# Patient Record
Sex: Female | Born: 1990 | Race: White | Hispanic: No | Marital: Single | Smoking: Former smoker
Health system: Southern US, Community
[De-identification: ages and names within clinical notes are randomized; demographics above are authoritative.]

## PROBLEM LIST (undated history)

## (undated) ENCOUNTER — Inpatient Hospital Stay: Payer: Self-pay

## (undated) DIAGNOSIS — D649 Anemia, unspecified: Secondary | ICD-10-CM

## (undated) DIAGNOSIS — R569 Unspecified convulsions: Secondary | ICD-10-CM

## (undated) DIAGNOSIS — F319 Bipolar disorder, unspecified: Secondary | ICD-10-CM

## (undated) DIAGNOSIS — F419 Anxiety disorder, unspecified: Secondary | ICD-10-CM

## (undated) HISTORY — PX: WISDOM TOOTH EXTRACTION: SHX21

## (undated) HISTORY — PX: OTHER SURGICAL HISTORY: SHX169

## (undated) HISTORY — PX: TONSILLECTOMY: SUR1361

## (undated) HISTORY — PX: ADENOIDECTOMY: SUR15

---

## 2015-10-09 ENCOUNTER — Emergency Department (HOSPITAL_COMMUNITY)
Admission: EM | Admit: 2015-10-09 | Discharge: 2015-10-09 | Disposition: A | Discharge status: Home / Self Care | Payer: Self-pay | Attending: Emergency Medicine | Admitting: Emergency Medicine

## 2015-10-09 ENCOUNTER — Encounter (HOSPITAL_COMMUNITY): Payer: Self-pay | Admitting: Emergency Medicine

## 2015-10-09 DIAGNOSIS — F17200 Nicotine dependence, unspecified, uncomplicated: Secondary | ICD-10-CM | POA: Insufficient documentation

## 2015-10-09 DIAGNOSIS — F419 Anxiety disorder, unspecified: Secondary | ICD-10-CM | POA: Insufficient documentation

## 2015-10-09 DIAGNOSIS — F319 Bipolar disorder, unspecified: Secondary | ICD-10-CM | POA: Insufficient documentation

## 2015-10-09 HISTORY — DX: Bipolar disorder, unspecified: F31.9

## 2015-10-09 HISTORY — DX: Anxiety disorder, unspecified: F41.9

## 2015-10-09 MED ORDER — LURASIDONE HCL 60 MG PO TABS: 60.0000 mg | ORAL_TABLET | Freq: Every evening | ORAL | Status: DC

## 2015-10-09 MED ORDER — BUSPIRONE HCL 10 MG PO TABS: 10.0000 mg | ORAL_TABLET | Freq: Two times a day (BID) | ORAL | Status: DC

## 2015-10-09 NOTE — ED Provider Notes (Signed)
CSN: [id number]     Arrival date & time 10/09/15  1628 History   First MD Initiated Contact with Patient 10/09/15 2008     Chief Complaint  Patient presents with  . Medical Clearance  . Medication Refill     (Consider location/radiation/quality/duration/timing/severity/associated sxs/prior Treatment) HPI Comments: 25 year old female with a history of bipolar 1 disorder and anxiety presents to the emergency department trying to get back on her medications. She states that she has been diagnosed with bipolar and was previously on BuSpar and JordanLatuda. She has been out of these medications since February 2017. She states that she has had worsening agitation and anxiety as well as difficulty forming complete thoughts. Patient also complaining of trouble focusing. She feels as though her symptoms represent some mild mania. She has had no suicidal or homicidal thoughts. She had difficulty getting her medication secondary to labs in insurance coverage. She moved to [place] 1 week ago and is now with family. She considers this to be a good support system. She has plans to reach out to behavioral health services in the area. She is hoping to get back on her medications that she was previously prescribed.  The history is provided by the patient. No language interpreter was used.    Past Medical History  Diagnosis Date  . Bipolar 1 disorder (HCC)   . Anxiety    Past Surgical History  Procedure Laterality Date  . Tailbone    . Tonsillectomy    . Adenoidectomy    . Wisdom tooth extraction     No family history on file. Social History  Substance Use Topics  . Smoking status: Current Some Day Smoker  . Smokeless tobacco: None  . Alcohol Use: Yes   OB History    No data available      Review of Systems  Psychiatric/Behavioral: Positive for behavioral problems and agitation. Negative for suicidal ideas. The patient is nervous/anxious.   All other systems reviewed and are  negative.   Allergies  Decadron  Home Medications   Prior to Admission medications   Medication Sig Start Date End Date Taking? Authorizing Provider  acetaminophen (TYLENOL) 500 MG tablet Take 1,000 mg by mouth every 6 (six) hours as needed (for migraine).   Yes Historical Provider, MD  B Complex Vitamins (B COMPLEX 1 PO) Take 1 mg by mouth 4 (four) times daily. Liquid   Yes Historical Provider, MD  Multiple Vitamins-Minerals (ONE-A-DAY FOR HER VITACRAVES) CHEW Chew 1 tablet by mouth daily.   Yes Historical Provider, MD  busPIRone (BUSPAR) 10 MG tablet Take 1 tablet (10 mg total) by mouth 2 (two) times daily. 10/09/15   Antony MaduraKelly Mindie Rawdon, PA-C  Lurasidone HCl (LATUDA) 60 MG TABS Take 60 mg by mouth every evening. 10/09/15   Antony MaduraKelly Fortunata Betty, PA-C   BP 123/93 mmHg  Pulse 91  Temp(Src) 99.2 F (37.3 C) (Oral)  Resp 18  SpO2 100%  LMP 09/28/2015   Physical Exam  Constitutional: She is oriented to person, place, and time. She appears well-developed and well-nourished. No distress.  HENT:  Head: Normocephalic and atraumatic.  Eyes: Conjunctivae and EOM are normal. No scleral icterus.  Neck: Normal range of motion.  Pulmonary/Chest: Effort normal. No respiratory distress.  Musculoskeletal: Normal range of motion.  Neurological: She is alert and oriented to person, place, and time. She exhibits normal muscle tone. Coordination normal.  Skin: Skin is warm and dry. No rash noted. She is not diaphoretic. No erythema. No pallor.  Psychiatric: Her  mood appears anxious (mild). Her affect is not angry. She is agitated (mild). She is not aggressive. She expresses no homicidal and no suicidal ideation.  Nursing note and vitals reviewed.   ED Course  Procedures (including critical care time) Labs Review Labs Reviewed - No data to display  Imaging Review No results found.   I have personally reviewed and evaluated these images and lab results as part of my medical decision-making.   EKG  Interpretation None      MDM   Final diagnoses:  Bipolar 1 disorder (HCC)    25 year old female presents to the emergency department requesting that her psychiatric medications be refilled. She has a history of bipolar 1 disorder, previously seen by psychiatry in [place], and has been off of her JordanLatuda and Buspar for 5 months. Patient with no SI/HI. She has moved to the area as this is where her family resides. She appears to have a good support system. Patient forming logical, well educated thoughts despite feeling as though her lack of medical management has been affecting her focus and memory. I do not believe her to be a harm to herself or others.  Patient making contact with outside psychiatric services for further care. I do not believe that refilling one month of her psychiatric medications is unreasonable. I discussed restarting these medications with Fayrene FearingJames, of pharmacy, to ensure no taper required. Will re-initiate Buspar 10mg  BID and Latuda 60mg  QHS. Resource guide and return precautions given. Patient discharged in satisfactory condition with no unaddressed concerns.   Filed Vitals:   10/09/15 1636  BP: 123/93  Pulse: 91  Temp: 99.2 F (37.3 C)  TempSrc: Oral  Resp: 18  SpO2: 100%     Antony MaduraKelly Ceniya Fowers, PA-C 10/09/15 2218  Jerelyn ScottMartha Linker, MD 10/09/15 2225

## 2015-10-09 NOTE — ED Notes (Signed)
Pt states 'I came here from [place] last week, ive been without my medication since April, i was diagnosed with Bipolar disorder, im just trying to get back on my medication., latuda and Buspar". Pt states "I'm very manic right now, im having trouble with memory, focusing and irritation.". Denies thoughts of hurting self or others.

## 2015-10-09 NOTE — Discharge Instructions (Signed)
Bipolar Disorder °Bipolar disorder is a mental illness. The term bipolar disorder actually is used to describe a group of disorders that all share varying degrees of emotional highs and lows that can interfere with daily functioning, such as work, school, or relationships. Bipolar disorder also can lead to drug abuse, hospitalization, and suicide. °The emotional highs of bipolar disorder are periods of elation or irritability and high energy. These highs can range from a mild form (hypomania) to a severe form (mania). People experiencing episodes of hypomania may appear energetic, excitable, and highly productive. People experiencing mania may behave impulsively or erratically. They often make poor decisions. They may have difficulty sleeping. The most severe episodes of mania can involve having very distorted beliefs or perceptions about the world and seeing or hearing things that are not real (psychotic delusions and hallucinations).  °The emotional lows of bipolar disorder (depression) also can range from mild to severe. Severe episodes of bipolar depression can involve psychotic delusions and hallucinations. °Sometimes people with bipolar disorder experience a state of mixed mood. Symptoms of hypomania or mania and depression are both present during this mixed-mood episode. °SIGNS AND SYMPTOMS °There are signs and symptoms of the episodes of hypomania and mania as well as the episodes of depression. The signs and symptoms of hypomania and mania are similar but vary in severity. They include: °· Inflated self-esteem or feeling of increased self-confidence. °· Decreased need for sleep. °· Unusual talkativeness (rapid or pressured speech) or the feeling of a need to keep talking. °· Sensation of racing thoughts or constant talking, with quick shifts between topics that may or may not be related (flight of ideas). °· Decreased ability to focus or concentrate. °· Increased purposeful activity, such as work, studies,  or social activity, or nonproductive activity, such as pacing, squirming and fidgeting, or finger and toe tapping. °· Impulsive behavior and use of poor judgment, resulting in high-risk activities, such as having unprotected sex or spending excessive amounts of money. °Signs and symptoms of depression include the following:  °· Feelings of sadness, hopelessness, or helplessness. °· Frequent or uncontrollable episodes of crying. °· Lack of feeling anything or caring about anything. °· Difficulty sleeping or sleeping too much.  °· Inability to enjoy the things you used to enjoy.   °· Desire to be alone all the time.   °· Feelings of guilt or worthlessness.  °· Lack of energy or motivation.   °· Difficulty concentrating, remembering, or making decisions.  °· Change in appetite or weight beyond normal fluctuations. °· Thoughts of death or the desire to harm yourself. °DIAGNOSIS  °Bipolar disorder is diagnosed through an assessment by your caregiver. Your caregiver will ask questions about your emotional episodes. There are two main types of bipolar disorder. People with type I bipolar disorder have manic episodes with or without depressive episodes. People with type II bipolar disorder have hypomanic episodes and major depressive episodes, which are more serious than mild depression. The type of bipolar disorder you have can make an important difference in how your illness is monitored and treated. °Your caregiver may ask questions about your medical history and use of alcohol or drugs, including prescription medication. Certain medical conditions and substances also can cause emotional highs and lows that resemble bipolar disorder (secondary bipolar disorder).  °TREATMENT  °Bipolar disorder is a long-term illness. It is best controlled with continuous treatment rather than treatment only when symptoms occur. The following treatments can be prescribed for bipolar disorders: °· Medication--Medication can be prescribed by  a doctor that   is an expert in treating mental disorders (psychiatrists). Medications called mood stabilizers are usually prescribed to help control the illness. Other medications are sometimes added if symptoms of mania, depression, or psychotic delusions and hallucinations occur despite the use of a mood stabilizer.  Talk therapy--Some forms of talk therapy are helpful in providing support, education, and guidance. A combination of medication and talk therapy is best for managing the disorder over time. A procedure in which electricity is applied to your brain through your scalp (electroconvulsive therapy) is used in cases of severe mania when medication and talk therapy do not work or work too slowly.   This information is not intended to replace advice given to you by your health care provider. Make sure you discuss any questions you have with your health care provider.   Document Released: 06/27/2000 Document Revised: 04/11/2014 Document Reviewed: 04/16/2012 Elsevier Interactive Patient Education 2016 [employer]  State Street CorporationCommunity Resource Guide Outpatient Counseling/Substance Abuse Adult The United Ways 211 is a great source of information about community services available.  Access by dialing 2-1-1 from anywhere in [place], or by website -  PooledIncome.plwww.nc211.org.   Other Local Resources (Updated 04/2015)  Crisis Hotlines   Services     Area Served  [employer] [facility]  Crisis Hotline, available 24 hours a day, 7 days a week: [phone] [address] [facility] [place], [place]   [facility]  Crisis Hotline, available 24 hours a day, 7 days a week: 6415656888458-348-1085 [facility] [place], [place]  [facility]  Suicide Prevention Hotline, available 24 hours a day, 7 days a week: [phone]) 398-8694 [facility] [place], [place]  [facility]   Crisis Hotline, available 24 hours a day, 7 days a week: [phone])502-4048 [facility] [place], [place]   [facility] Access to Ford Motor CompanyCare Line  Crisis Hotline, available 24  hours a day, 7 days a week: [phone])[phone] All   [facility]  Crisis Hotline, available 24 hours a day, 7 days a week: [phone](857)173-7484 All   Other Local Resources (Updated 04/2015)  Outpatient Counseling/ Substance Abuse Programs  Services     Address and Phone Number  ADS (Alcohol and Drug Services)   Options include Individual counseling, group counseling, intensive outpatient program (several hours a day, several days a week)  Offers depression assessments  Provides methadone maintenance program [phone] [address], [address] [place], [place] [address]   [facility]   Offers partial hospitalization/day treatment and DUI/DWI programs  Saks Incorporatedccepts Medicare, private insurance [phone] [address], [address] [place], [place] [address]  [facility]    Services include intensive outpatient program (several hours a day, several days a week), outpatient treatment, DUI/DWI services, family education  Also has some services specifically for [employer]eterans  Offers transitional housing  [phone] [address] [place], [place] [address]     [facility]  Saks Incorporatedccepts Medicare, private pay, and private insurance [phone] [address], [address] [place], [place] [address]  [facility]  Services include individual counseling, substance abuse intensive outpatient program (several hours a day, several days a week), day treatment  Delene Lollccepts Medicare, Medicaid, private insurance [phone] [address], [address] [place], [place] [address]  [facility]   Offers substance abuse intensive outpatient program (several hours a day, several days a week), partial hospitalization program [phone] [address] [place], [place] [address]  [phone] [address] [place], [place] [address]  [phone] [address] [place], [place]  [address]  [phone] [address]  [address] [place], [place] [address]  [facility]  Individual counseling only  Accepts private insurance only [phone] [address], [address] [place], [place] [address]  [facility]  Methadone maintenance program [phone] [address] [place], [place] [address]  [facility]  Walk-In Clinic providing substance abuse and mental health counseling  Accepts Medicaid, Medicare, private insurance  Offers sliding scale for uninsured [phone] [address] [place], [place]   [facility]Families, Avnetnc.  Offers individual counseling, and intensive in-home services [phone] [address], [address] [place], [place] [address]  [facility]  Offers individual counseling, family counseling, group therapy, domestic violence counseling, consumer credit counseling  Accepts Medicare, Medicaid, private insurance  Offers sliding scale for uninsured [phone] [address] [place], [place] [address]  [phone] [facility], [address] [place], [place] [address]  [facility]  Offers individual, family and group counseling  3 locations - [place], [place], and [place]  [phone]  [address] [place], [place] [address]  [address] [place], [place] [address]  [address] [place], [place] [address]  [facility]    Offers psychiatric assessment, 8-week Intensive Outpatient Program (several hours a day, several times a week, daytime or evenings), early recovery group, family Program, medication management  Private pay or private insurance only [phone], or  [phone] [address] [place], [place] [address]  [facility]  Offers individual, couples and family counseling  Accepts Medicaid, private insurance, and sliding scale for uninsured [phone] [address] [place], [place] [address]  Len Blalockavid Fuller, MD  Individual  counseling  Private insurance [phone] [address] [place], [place] [address]  [facility]   Offers assessment, substance abuse treatment, and behavioral health treatment [phone] [address] [place], [place] [address]  [facility]  Individual counseling  Accepts private insurance [phone] [address] [place], [place] [address]  [facility]  Individual counseling  Delene Lollccepts Medicare, private insurance [phone] [address] [place], [place] [address]  [facility]    Offers intensive outpatient program (several hours a day, several times a week)  Private pay, private insurance [phone] [facility]ol[address] [place], [place]  [facility]  Individual counseling  Medicare, private insurance [phone] [address], [address] [place], [place] [address]  [facility]    Offers intensive outpatient program (several hours a day, several times a week) and partial hospitalization program [phone] [address] [place], [place] [address]  Emerson MonteParrish McKinney, MD  Individual counseling [phone] [address], [address] [place], [place] [address]  [facility]  Offers Christian counseling to individuals, couples, and families  Accepts Medicare and private insurance; offers sliding scale for uninsured [phone] [address] [place], [place] [address]  [facility]  [facility] Retre[facility]ian counseling [phone] [address], [address] [place], [place] [address]  [facility]   Offers crisis counseling, individual counseling, group therapy, in-home therapy, domestic violence services, day treatment, DWI services, Administrator, artsCommunity Support Team (CST), Assertive Community Treatment Team (ACTT), substance abuse Intensive Outpatient Program (several hours a day, several times a week)  2  locations - [place] and [place] [phone] [address] [place], [place] [address]  [phone] [address] [place], [place] [address]  [facility]     Individual counseling and group therapy  Accepts private insurance, Middle RiverMedicare, [place]icaid [phone] [address] [place]o [place], [place]  [facility]  Offers individual and family counseling  Offers LGBTQ services  Accepts private insurance and private pay [phone] [address] [place], [place] [address]  [facility]    Offers individual counseling, group therapy, and outpatient detox  Accepts private insurance [phone] [address] [place], [place]  [facility]  Individual counseling  Accepts Medicare, private insurance [phone] [address], [address] [place], [place] [address]  [employer]  Individual counseling  Accepts Medicare, private insurance [phone] [address] [place], [place] [address]  [facility]   Offers substance abuse Intensive Outpatient Program (several hours a day, several times a week) [phone], or [phone] [place], [place]   [employer] The United Ways 211 is a great source of information about community services available.  Access by dialing 2-1-1 from anywhere in [place], or by website -  PooledIncome.pl.   Other Local Resources (Updated 04/2015)  Financial Assistance   Services    Phone Number and Address  [facility]  Low-cost medical care - 1st and 3rd Saturday of every month  Must not qualify for public or private insurance and must have limited income [phone] [address] [place], [place]    [place] [employer] of Social Services  Child care  Emergency assistance for housing and Kimberly-Clark  Medicaid [phone] [address] [place], [place] [address]   [facility] Department  Low-cost medical care for children, communicable diseases, sexually-transmitted diseases, immunizations, maternity care, womens health and family planning [phone] [address] [place], [place] [address]  [facility]   Medication assistance for [facility] residents  Must meet income requirements [phone] [address] [place], [place].    [facility] Social Services  Child care  Emergency assistance for housing and Kimberly-Clark  Medicaid [phone] [address] [place], [place] [address]  [facility]   Low-cost medical care,   Monday through Friday, 9 am to 6 pm.   Accepts Medicare/Medicaid, and self-pay [phone] [address] [place], [place] [address]  [facility]  Low-cost medical care - Monday through Friday, 8:30 am - 5:30 pm  Accepts Medicaid and self-pay [phone] [address] [place], [place] [address]   [facility]  Primary medical care, including for those with sickle cell disease  Accepts Medicare, Medicaid, insurance and self-pay [phone] [address] [place], [place]  [facility]   Primary medical care  Accepts Medicare, [place], insurance and self-pay [phone] [address], [address] [place], [place] [address]   [facility] Department of Social Services  Child care  Emergency assistance for housing and Kimberly-Clark  Medicaid [phone] [address] [place], [place] [address]  [facility] Department of Health and [employer]  Child care  Emergency assistance for housing and Kimberly-Clark  Medicaid [phone] [address] [place], [place] [address]   [facility] Medication Assistance Program  Medication assistance for  [facility] residents with no insurance only  Must have a primary care doctor [phone] [address], [address] [place], [place]  [facility]   Primary medical care  [place], [place], insurance  [phone] [address] [place], [place]  [employer]M   Medication assistance [phone]  [facility]   Primary  medical care  Accepts Medicare, Medicaid, insurance and self-pay [phone] [address] [place], [place] [address]  [facility]   Primary medical care  Accepts Medicare, [place], insurance and self-pay [phone] [address] [place], [place] [address]  [facility]  For [place] residents between the ages of 79 and 47 who do not have any form of health insurance, Medicare, [place], or [place] benefits.  Services are provided free of charge to uninsured patients who fall within federal poverty guidelines.    Hours: Tuesdays and Thursdays, 4:15 - 8 pm [phone] [address], [address] [place], [place] [address]  [facility]     Primary medical care  Dental care  Nutritional counseling  Pharmacy  Accepts Medicaid, Medicare, most insurance.  Fees are adjusted based on ability to pay.   [phone] [facility] [address] [place], [place]  [phone] [facility] [address] [place], [place]  [phone] [facility] [place], [place]  [phone] [facility], [address] [place], [place]  [phone] [facility] [address] [place], [place]  [facility]  Womens health and family planning [phone] [address] [place], [place]  [facility] Department of Social Services  Child care  Emergency assistance for housing and Kimberly-Clark  Medicaid [phone] N.  [address] [place], [place] [address]   [facility]    Ages 22 and older  Hours: Mondays and Thursdays, 7:00 am - 9:00 am Patients are seen on a first come, first served basis. [phone], [phone] [place], [place]  [facility] Division of Social Services  Child care  Emergency assistance for housing and Kimberly-Clark  Medicaid [phone] [address] [place], [place] [address]  [facility]  Medication assistance  Rental assistance  Food pantry  Medication assistance  Housing assistance  Emergency food distribution  Utility assistance [phone] [address] [place], [place]  [phone]  [address] [place], [place] [address] Hours: Tuesdays and Thursdays from 9am - 12 noon by appointment only  [phone] [address] [place], [place] [address]  [facility] - Lanae Boast   Accepts private insurance, [place], and [place].  Payment is based on a sliding scale for those without insurance.  Hours: Mondays, Tuesdays and Thursdays, 8:30 am - 5:30 pm.   [phone] [address] Robin[address] [place], [place]  [facility] - Family Medicine at [facility], [place], and [place].  Payment is based on a sliding scale for those without insurance. [phone] [address] [place], [place]  [facility] - Pediatrics at E. Scientist, research (physical sciences), [employer], and [place].  Payment is based on a sliding scale for those without insurance [phone] [address], [employer], [place]  [facility] - Pediatrics at [employer], [place], and [place].  Payment is based on a sliding scale for those without insurance. [phone] [address] [place], [place]  [facility] - Pediatrics at [facility], [place], and [place].  Payment is based on a sliding scale for those without insurance. [phone], [phone] [place], [place].    [facility]  Maternity care.  Accepts Medicaid and self-pay. [phone] [address]  [address] [place], [place]

## 2015-10-09 NOTE — ED Notes (Signed)
Pt st's she has been out her meds for for several months.  Pt st's she had a refill at the time but did not have the money to get them filled.

## 2015-10-13 ENCOUNTER — Ambulatory Visit: Payer: Self-pay

## 2015-10-18 ENCOUNTER — Encounter (HOSPITAL_COMMUNITY): Payer: Self-pay | Admitting: Emergency Medicine

## 2015-10-18 ENCOUNTER — Emergency Department (HOSPITAL_COMMUNITY): Payer: Self-pay

## 2015-10-18 ENCOUNTER — Emergency Department (HOSPITAL_COMMUNITY)
Admission: EM | Admit: 2015-10-18 | Discharge: 2015-10-18 | Disposition: A | Discharge status: Home / Self Care | Payer: Self-pay | Attending: Emergency Medicine | Admitting: Emergency Medicine

## 2015-10-18 DIAGNOSIS — N390 Urinary tract infection, site not specified: Secondary | ICD-10-CM | POA: Insufficient documentation

## 2015-10-18 DIAGNOSIS — R109 Unspecified abdominal pain: Secondary | ICD-10-CM | POA: Insufficient documentation

## 2015-10-18 DIAGNOSIS — Z79899 Other long term (current) drug therapy: Secondary | ICD-10-CM | POA: Insufficient documentation

## 2015-10-18 DIAGNOSIS — F17200 Nicotine dependence, unspecified, uncomplicated: Secondary | ICD-10-CM | POA: Insufficient documentation

## 2015-10-18 LAB — COMPREHENSIVE METABOLIC PANEL
ALBUMIN: 4.1 g/dL (ref 3.5–5.0)
ALT: 14 U/L (ref 14–54)
ANION GAP: 6 (ref 5–15)
AST: 17 U/L (ref 15–41)
Alkaline Phosphatase: 63 U/L (ref 38–126)
BUN: 8 mg/dL (ref 6–20)
CHLORIDE: 105 mmol/L (ref 101–111)
CO2: 28 mmol/L (ref 22–32)
Calcium: 9.4 mg/dL (ref 8.9–10.3)
Creatinine, Ser: 0.77 mg/dL (ref 0.44–1.00)
GFR calc Af Amer: >60 mL/min (ref >60)
GFR calc non Af Amer: >60 mL/min (ref >60)
GLUCOSE: 89 mg/dL (ref 65–99)
POTASSIUM: 3.6 mmol/L (ref 3.5–5.1)
SODIUM: 139 mmol/L (ref 135–145)
TOTAL PROTEIN: 7 g/dL (ref 6.5–8.1)
Total Bilirubin: 0.8 mg/dL (ref 0.3–1.2)

## 2015-10-18 LAB — CBC WITH DIFFERENTIAL/PLATELET
BASOS ABS: 0.1 10*3/uL (ref 0.0–0.1)
BASOS PCT: 1 %
EOS ABS: 0.5 10*3/uL (ref 0.0–0.7)
EOS PCT: 4 %
HCT: 42.8 % (ref 36.0–46.0)
Hemoglobin: 13.9 g/dL (ref 12.0–15.0)
Lymphocytes Relative: 30 %
Lymphs Abs: 3.4 10*3/uL (ref 0.7–4.0)
MCH: 28.7 pg (ref 26.0–34.0)
MCHC: 32.5 g/dL (ref 30.0–36.0)
MCV: 88.4 fL (ref 78.0–100.0)
MONO ABS: 0.5 10*3/uL (ref 0.1–1.0)
MONOS PCT: 5 %
NEUTROS ABS: 6.8 10*3/uL (ref 1.7–7.7)
Neutrophils Relative %: 60 %
PLATELETS: 354 10*3/uL (ref 150–400)
RBC: 4.84 MIL/uL (ref 3.87–5.11)
RDW: 12.7 % (ref 11.5–15.5)
WBC: 11.2 10*3/uL — ABNORMAL HIGH (ref 4.0–10.5)

## 2015-10-18 LAB — LIPASE, BLOOD: Lipase: 39 U/L (ref 11–51)

## 2015-10-18 LAB — URINALYSIS, ROUTINE W REFLEX MICROSCOPIC
Bilirubin Urine: NEGATIVE
GLUCOSE, UA: NEGATIVE mg/dL
Ketones, ur: NEGATIVE mg/dL
NITRITE: NEGATIVE
PROTEIN: NEGATIVE mg/dL
Specific Gravity, Urine: 1.009 (ref 1.005–1.030)
pH: 6 (ref 5.0–8.0)

## 2015-10-18 LAB — URINE MICROSCOPIC-ADD ON

## 2015-10-18 LAB — I-STAT BETA HCG BLOOD, ED (MC, WL, AP ONLY): I-stat hCG, quantitative: 14.9 m[IU]/mL — ABNORMAL HIGH (ref <5)

## 2015-10-18 MED ORDER — CEPHALEXIN 500 MG PO CAPS: 500.0000 mg | ORAL_CAPSULE | Freq: Two times a day (BID) | ORAL | Status: DC

## 2015-10-18 MED ORDER — PHENAZOPYRIDINE HCL 200 MG PO TABS: 200.0000 mg | ORAL_TABLET | Freq: Three times a day (TID) | ORAL | Status: DC

## 2015-10-18 MED ORDER — KETOROLAC TROMETHAMINE 30 MG/ML IJ SOLN: 30.0000 mg | Freq: Once | INTRAMUSCULAR | Status: DC

## 2015-10-18 MED ORDER — KETOROLAC TROMETHAMINE 30 MG/ML IJ SOLN
30.0000 mg | Freq: Once | INTRAMUSCULAR | Status: AC
  Administered 2015-10-18: 30 mg via INTRAVENOUS
  Filled 2015-10-18: qty 1

## 2015-10-18 MED ORDER — MORPHINE SULFATE (PF) 4 MG/ML IV SOLN
4.0000 mg | Freq: Once | INTRAVENOUS | Status: AC
  Administered 2015-10-18: 4 mg via INTRAVENOUS
  Filled 2015-10-18: qty 1

## 2015-10-18 MED ORDER — SODIUM CHLORIDE 0.9 % IV BOLUS (SEPSIS)
1000.0000 mL | Freq: Once | INTRAVENOUS | Status: AC
  Administered 2015-10-18: 1000 mL via INTRAVENOUS

## 2015-10-18 NOTE — ED Notes (Signed)
CT called r/e patient's hcg result - pt reports just having had a miscarriage the last week in June - [place], [place] discussed with Dr. Deretha EmoryZackowski, who sts it is normally increased after miscarriage and that pt is okay to go for CT. PA explained risks/benefits with patient, who agrees to have scan done.

## 2015-10-18 NOTE — ED Notes (Signed)
Patient ambulatory to restroom with steady gait.

## 2015-10-18 NOTE — ED Provider Notes (Signed)
CSN: [id number]     Arrival date & time 10/18/15  1522 History  By signing my name below, I, Evon Slack, attest that this documentation has been prepared under the direction and in the presence of [employer], [place]. Electronically Signed: Evon Slack, ED Scribe. 10/18/2015. 5:45 PM.     Chief Complaint  Patient presents with  . Dysuria   The history is provided by the patient. No language interpreter was used.   HPI Comments: Jill Daniel is a 25 y.o. female who presents to the Emergency Department complaining of dysuria onset 4 days prior. Pt reports that she has associated right flank pain, frequency, urgency and hematuria. She also reports nausea, vomiting x1 yesterday. She states that the flank pain does radiate intermittently around to her groin when urinating. She describes the pain when urinating as pressure on her low back. Pt denies any medications PTA. Denies fever, chills, CP, SOB, abdominal pain, vaginal bleeding or vaginal discharge. Denies Hx of abdominal surgeries. LMP 09/28/15. Pt reports that on 09/28/15 she was hospitalized in [place] for a miscarriage. She denies any complications and states she was d/c home a few days later. She notes she recently moved from [place] and has not followed up with an OBGYN since being discharged. Pt denies being sexually active since her miscarriage.   Past Medical History  Diagnosis Date  . Bipolar 1 disorder (HCC)   . Anxiety    Past Surgical History  Procedure Laterality Date  . Tailbone    . Tonsillectomy    . Adenoidectomy    . Wisdom tooth extraction     No family history on file. Social History  Substance Use Topics  . Smoking status: Current Some Day Smoker  . Smokeless tobacco: None  . Alcohol Use: Yes   OB History    No data available      Review of Systems  Constitutional: Negative for fever.  Gastrointestinal: Positive for vomiting.  Genitourinary: Positive for dysuria, urgency,  frequency, hematuria and flank pain. Negative for vaginal bleeding and vaginal discharge.     Allergies  Decadron  Home Medications   Prior to Admission medications   Medication Sig Start Date End Date Taking? Authorizing Provider  acetaminophen (TYLENOL) 500 MG tablet Take 1,000 mg by mouth every 6 (six) hours as needed (for migraine).    Historical Provider, MD  B Complex Vitamins (B COMPLEX 1 PO) Take 1 mg by mouth 4 (four) times daily. Liquid    Historical Provider, MD  busPIRone (BUSPAR) 10 MG tablet Take 1 tablet (10 mg total) by mouth 2 (two) times daily. 10/09/15   Antony Madura, PA-C  cephALEXin (KEFLEX) 500 MG capsule Take 1 capsule (500 mg total) by mouth 2 (two) times daily. 10/18/15   Barrett Henle, PA-C  Lurasidone HCl (LATUDA) 60 MG TABS Take 60 mg by mouth every evening. 10/09/15   Antony Madura, PA-C  Multiple Vitamins-Minerals (ONE-A-DAY FOR HER VITACRAVES) CHEW Chew 1 tablet by mouth daily.    Historical Provider, MD  phenazopyridine (PYRIDIUM) 200 MG tablet Take 1 tablet (200 mg total) by mouth 3 (three) times daily. 10/18/15   Satira Sark Deyani Hegarty, PA-C   BP 114/64 mmHg  Pulse 68  Temp(Src) 98.8 F (37.1 C) (Oral)  Resp 16  Ht  (1.575 m)  Wt 75.297 kg  BMI 30.35 kg/m2  SpO2 100%  LMP 09/28/2015   Physical Exam  Constitutional: She is oriented to person, place, and time. She appears well-developed  and well-nourished. No distress.  HENT:  Head: Normocephalic and atraumatic.  Mouth/Throat: Uvula is midline, oropharynx is clear and moist and mucous membranes are normal. No oropharyngeal exudate, posterior oropharyngeal edema, posterior oropharyngeal erythema or tonsillar abscesses.  Eyes: Conjunctivae and EOM are normal. Right eye exhibits no discharge. Left eye exhibits no discharge. No scleral icterus.  Neck: Normal range of motion. Neck supple.  Cardiovascular: Normal rate, regular rhythm, normal heart sounds and intact distal pulses.    Pulmonary/Chest: Effort normal and breath sounds normal. No respiratory distress. She has no wheezes. She has no rales. She exhibits no tenderness.  Abdominal: Soft. Bowel sounds are normal. She exhibits no distension and no mass. There is no tenderness. There is no rebound and no guarding.  Right CVA tenderness.  Musculoskeletal: Normal range of motion. She exhibits no edema.  Lymphadenopathy:    She has no cervical adenopathy.  Neurological: She is alert and oriented to person, place, and time.  Skin: Skin is warm and dry. She is not diaphoretic.  Nursing note and vitals reviewed.   ED Course  Procedures (including critical care time) DIAGNOSTIC STUDIES: Oxygen Saturation is 98% on RA, normal by my interpretation.    COORDINATION OF CARE: 5:45 PM-Discussed treatment plan which includes UA with pt at bedside and pt agreed to plan.     Labs Review Labs Reviewed  URINALYSIS, ROUTINE W REFLEX MICROSCOPIC (NOT AT [facility]) - Abnormal; Notable for the following:    APPearance CLOUDY (*)    Hgb urine dipstick LARGE (*)    Leukocytes, UA LARGE (*)    All other components within normal limits  URINE MICROSCOPIC-ADD ON - Abnormal; Notable for the following:    Squamous Epithelial / LPF 0-5 (*)    Bacteria, UA FEW (*)    All other components within normal limits  CBC WITH DIFFERENTIAL/PLATELET - Abnormal; Notable for the following:    WBC 11.2 (*)    All other components within normal limits  I-STAT BETA HCG BLOOD, ED ([facility], [facility], [facility] ONLY) - Abnormal; Notable for the following:    I-stat hCG, quantitative 14.9 (*)    All other components within normal limits  URINE CULTURE  COMPREHENSIVE METABOLIC PANEL  LIPASE, BLOOD  POC URINE PREG, ED    Imaging Review Ct Renal Stone Study  10/18/2015  CLINICAL DATA:  Hematuria.  Right-sided pain. EXAM: CT ABDOMEN AND PELVIS WITHOUT CONTRAST TECHNIQUE: Multidetector CT imaging of the abdomen and pelvis was performed following the standard protocol  without IV contrast. COMPARISON:  None. FINDINGS: Mild dependent atelectasis. No other abnormalities in the lung bases. No free air.  A small amount of free fluid is seen in the pelvis. No renal stones, hydronephrosis, or perinephric stranding. No ureteral stones are identified. The gallbladder is decompressed limiting evaluation with no abnormalities identified. The liver, spleen, adrenal glands, and pancreas are normal. The aorta is normal in caliber with no atherosclerosis. No adenopathy identified. The stomach and small bowel are normal. The colon is normal. High attenuation of normal caliber appendix could represent contrast for previous study versus an appendicolith. There is no evidence of acute appendicitis. The uterus is normal in appearance. There is a small amount of free fluid in the pelvis which could be physiologic. A right ovarian cyst is not excluded. No adenopathy or suspicious mass seen in the pelvis. Visualized bones are unremarkable. IMPRESSION: 1. No renal stones or obstruction. 2. High attenuation the proximal appendix could represent contrast from a previous study or a  small appendicolith. No evidence of appendicitis. 3. The small amount of free fluid in the pelvis is likely physiologic. A small cyst is not excluded in the right ovary. Electronically Signed   By: Gerome Sam III M.D   On: 10/18/2015 21:40   I have personally reviewed and evaluated these lab results as part of my medical decision-making.   EKG Interpretation None      MDM   Final diagnoses:  UTI (lower urinary tract infection)   Patient presents with right flank pain and associated dysuria, frequency, urgency and hematuria. Patient notes she had a miscarriage on 09/28/15. Denies any vaginal bleeding or discharge. She notes she has not followed up with OB/GYN due to recently moving from [place]. Denies fever. VSS. Exam revealed right CVA tenderness, abdominal exam benign, remaining exam unremarkable. Patient  given IV fluids, Toradol. Beta hCG 14.9, based off pt's reported hx of a miscarriage a few weeks ago, mildly elevated beta hcg appears to be consistent with values related to recent miscarriage and not a new pregnancy. UA positive for large hgb, large leuks, TNTC WBCs. Urine culture sent. WBC 11.2. Due to concern for kidney stone with associated UTI, will order CT renal study for further evaluation. CT revealed no renal stones, no evidence of appendicitis. On reevaluation pt reports her pain has improved. Pt able to tolerate PO in the ED. I suspect pt's sxs are likely due to UTI. Plan to discharge patient home with Keflex and symptomatically treatment. Discussed results and plan for discharge with patient. Patient given resources to follow up and establish care with with PCP and OB/GYN. Discussed return precautions with patient.  I personally performed the services described in this documentation, which was scribed in my presence. The recorded information has been reviewed and is accurate.      Satira Sark [place], [place] 10/18/15 5621  Vanetta Mulders, MD 10/19/15 [phone]

## 2015-10-18 NOTE — ED Notes (Signed)
Patient verbalized understanding of discharge instructions and denies any further needs or questions at this time. VS stable. Patient ambulatory with steady gait, RN escorted to ED entrance in wheelchair.   

## 2015-10-18 NOTE — ED Notes (Signed)
Patient returned from CT, requesting pain medication. PA aware.

## 2015-10-18 NOTE — ED Notes (Signed)
C/o painful urination and R side pain when urinating since last night.  Reports hematuria today.

## 2015-10-18 NOTE — Discharge Instructions (Signed)
Take your medications as prescribed. Please follow up with a primary care provider from the Resource Guide provided below in one week if her symptoms have not improved. Please return to the Emergency Department if symptoms worsen or new onset of fever, worsening flank pain or new abdominal pain, vomiting, unable to keep fluids down, unable to urinate.

## 2015-10-18 NOTE — ED Notes (Signed)
Informed consent/waiver signed by PA and patient. Patient transported to CT.

## 2015-10-20 ENCOUNTER — Encounter (HOSPITAL_COMMUNITY): Payer: Self-pay | Admitting: Emergency Medicine

## 2015-10-20 ENCOUNTER — Emergency Department (HOSPITAL_COMMUNITY): Payer: Self-pay

## 2015-10-20 ENCOUNTER — Emergency Department (HOSPITAL_COMMUNITY)
Admission: EM | Admit: 2015-10-20 | Discharge: 2015-10-21 | Disposition: A | Discharge status: Home / Self Care | Payer: Self-pay | Attending: Emergency Medicine | Admitting: Emergency Medicine

## 2015-10-20 DIAGNOSIS — R102 Pelvic and perineal pain: Secondary | ICD-10-CM | POA: Insufficient documentation

## 2015-10-20 DIAGNOSIS — N730 Acute parametritis and pelvic cellulitis: Secondary | ICD-10-CM | POA: Insufficient documentation

## 2015-10-20 DIAGNOSIS — F17200 Nicotine dependence, unspecified, uncomplicated: Secondary | ICD-10-CM | POA: Insufficient documentation

## 2015-10-20 LAB — URINALYSIS, ROUTINE W REFLEX MICROSCOPIC
BILIRUBIN URINE: NEGATIVE
Glucose, UA: NEGATIVE mg/dL
HGB URINE DIPSTICK: NEGATIVE
KETONES UR: NEGATIVE mg/dL
Leukocytes, UA: NEGATIVE
Nitrite: POSITIVE — AB
PROTEIN: NEGATIVE mg/dL
Specific Gravity, Urine: 1.016 (ref 1.005–1.030)
pH: 6 (ref 5.0–8.0)

## 2015-10-20 LAB — COMPREHENSIVE METABOLIC PANEL
ALBUMIN: 4 g/dL (ref 3.5–5.0)
ALK PHOS: 59 U/L (ref 38–126)
ALT: 16 U/L (ref 14–54)
ANION GAP: 5 (ref 5–15)
AST: 19 U/L (ref 15–41)
BILIRUBIN TOTAL: 0.1 mg/dL — AB (ref 0.3–1.2)
BUN: 8 mg/dL (ref 6–20)
CALCIUM: 9.1 mg/dL (ref 8.9–10.3)
CO2: 26 mmol/L (ref 22–32)
CREATININE: 0.92 mg/dL (ref 0.44–1.00)
Chloride: 107 mmol/L (ref 101–111)
GFR calc non Af Amer: >60 mL/min (ref >60)
GLUCOSE: 92 mg/dL (ref 65–99)
POTASSIUM: 4 mmol/L (ref 3.5–5.1)
SODIUM: 138 mmol/L (ref 135–145)
Total Protein: 6.8 g/dL (ref 6.5–8.1)

## 2015-10-20 LAB — CBC
HCT: 40.4 % (ref 36.0–46.0)
Hemoglobin: 13.4 g/dL (ref 12.0–15.0)
MCH: 28.9 pg (ref 26.0–34.0)
MCHC: 33.2 g/dL (ref 30.0–36.0)
MCV: 87.1 fL (ref 78.0–100.0)
PLATELETS: 334 10*3/uL (ref 150–400)
RBC: 4.64 MIL/uL (ref 3.87–5.11)
RDW: 12.6 % (ref 11.5–15.5)
WBC: 11.8 10*3/uL — ABNORMAL HIGH (ref 4.0–10.5)

## 2015-10-20 LAB — URINE MICROSCOPIC-ADD ON

## 2015-10-20 LAB — URINE CULTURE

## 2015-10-20 LAB — I-STAT CG4 LACTIC ACID, ED: Lactic Acid, Venous: 0.97 mmol/L (ref 0.5–1.9)

## 2015-10-20 LAB — I-STAT BETA HCG BLOOD, ED (MC, WL, AP ONLY): HCG, QUANTITATIVE: 10.1 m[IU]/mL — AB (ref <5)

## 2015-10-20 LAB — LIPASE, BLOOD: LIPASE: 28 U/L (ref 11–51)

## 2015-10-20 MED ORDER — SODIUM CHLORIDE 0.9 % IV BOLUS (SEPSIS)
1000.0000 mL | Freq: Once | INTRAVENOUS | Status: AC
  Administered 2015-10-20: 1000 mL via INTRAVENOUS

## 2015-10-20 MED ORDER — MORPHINE SULFATE (PF) 4 MG/ML IV SOLN
4.0000 mg | Freq: Once | INTRAVENOUS | Status: AC
  Administered 2015-10-20: 4 mg via INTRAVENOUS
  Filled 2015-10-20: qty 1

## 2015-10-20 MED ORDER — ONDANSETRON HCL 4 MG/2ML IJ SOLN
4.0000 mg | Freq: Once | INTRAMUSCULAR | Status: AC
Start: 2015-10-20 — End: 2015-10-20
  Administered 2015-10-20: 4 mg via INTRAVENOUS
  Filled 2015-10-20: qty 2

## 2015-10-20 NOTE — ED Notes (Signed)
Pt's mother called this RN into room and pt stated "a few minutes ago I was lying here and it felt like my heart started racing and I got spaced out and my oxygen level dropped and my chest started hutning" Pt alert and oriented x 4 and CP free now. This RN reviewed minute by minute HR and HR has not been above 100 according to the monitor. Pt has finger nail polish on and pulse oxemeter readjusted

## 2015-10-20 NOTE — ED Notes (Signed)
Pt sts currently being treated for UTI but not improving; pt sts fever and body aches

## 2015-10-20 NOTE — ED Provider Notes (Signed)
CSN: [id number]     Arrival date & time 10/20/15  1806 History   First MD Initiated Contact with Patient 10/20/15 2059     Chief Complaint  Patient presents with  . Urinary Tract Infection  . Fever     (Consider location/radiation/quality/duration/timing/severity/associated sxs/prior Treatment) HPI Patient was seen 2 days ago for dysuria. Complaint of flank pain and lower abdominal pain and time. CT renal stone study without any acute findings. Patient had many white blood cell UA. Treated for UTI with Keflex. Patient states that the pain has worsened in her abdomen and low back since that time. She has Episodic lightheadedness with standing and palpitations. She endorses subjective fevers. She denies any vaginal bleeding or discharge at this time. Patient recently had spontaneous abortion while living in [place]. Urinary symptoms have improved. She has normal bowel movements without diarrhea. Denies nausea or vomiting. Past Medical History  Diagnosis Date  . Bipolar 1 disorder (HCC)   . Anxiety    Past Surgical History  Procedure Laterality Date  . Tailbone    . Tonsillectomy    . Adenoidectomy    . Wisdom tooth extraction     History reviewed. No pertinent family history. Social History  Substance Use Topics  . Smoking status: Current Some Day Smoker  . Smokeless tobacco: None  . Alcohol Use: Yes   OB History    No data available     Review of Systems  Constitutional: Positive for fever, chills and fatigue.  Respiratory: Negative for cough and shortness of breath.   Cardiovascular: Positive for palpitations. Negative for chest pain.  Gastrointestinal: Positive for abdominal pain and abdominal distention. Negative for nausea, vomiting, diarrhea, constipation and anal bleeding.  Genitourinary: Positive for flank pain and pelvic pain. Negative for dysuria, frequency, hematuria, vaginal bleeding and vaginal discharge.  Musculoskeletal: Positive for myalgias and back pain.  Negative for neck pain and neck stiffness.  Skin: Negative for rash and wound.  Neurological: Positive for dizziness and light-headedness. Negative for syncope, weakness, numbness and headaches.  Psychiatric/Behavioral: The patient is nervous/anxious.   All other systems reviewed and are negative.     Allergies  Decadron and Lactose intolerance (gi)  Home Medications   Prior to Admission medications   Medication Sig Start Date End Date Taking? Authorizing Provider  acetaminophen (TYLENOL) 500 MG tablet Take 1,000 mg by mouth every 6 (six) hours as needed (for migraine).   Yes Historical Provider, MD  B Complex Vitamins (B COMPLEX 1 PO) Take 1 mg by mouth 4 (four) times daily. Liquid   Yes Historical Provider, MD  busPIRone (BUSPAR) 10 MG tablet Take 1 tablet (10 mg total) by mouth 2 (two) times daily. 10/09/15  Yes Antony Madura, PA-C  cephALEXin (KEFLEX) 500 MG capsule Take 1 capsule (500 mg total) by mouth 2 (two) times daily. 10/18/15  Yes Barrett Henle, PA-C  Lurasidone HCl (LATUDA) 60 MG TABS Take 60 mg by mouth every evening. 10/09/15  Yes Antony Madura, PA-C  Multiple Vitamins-Minerals (ONE-A-DAY FOR HER VITACRAVES) CHEW Chew 1 tablet by mouth daily.   Yes Historical Provider, MD  phenazopyridine (PYRIDIUM) 200 MG tablet Take 1 tablet (200 mg total) by mouth 3 (three) times daily. 10/18/15  Yes Satira Sark [place]adeau, PA-C   BP 118/72 mmHg  Pulse 87  Temp(Src) 98.3 F (36.8 C) (Oral)  Resp 18  SpO2 99%  LMP 09/28/2015 Physical Exam  Constitutional: She is oriented to person, place, and time. She appears well-developed and well-nourished. No  distress.  Anxious appearing  HENT:  Head: Normocephalic and atraumatic.  Mouth/Throat: Oropharynx is clear and moist. No oropharyngeal exudate.  Eyes: EOM are normal. Pupils are equal, round, and reactive to light.  Neck: Normal range of motion. Neck supple.  Cardiovascular: Normal rate and regular rhythm.   Pulmonary/Chest: Effort  normal and breath sounds normal. No respiratory distress. She has no wheezes. She has no rales.  Abdominal: Soft. Bowel sounds are normal. She exhibits distension (mild distention). She exhibits no mass. There is tenderness (diffuse abdominal tenderness especially in the lower abdomen. No rebound or guarding.). There is no rebound and no guarding.  Genitourinary:  Copious amount of purulent discharge coming from the os. Cervical motion tenderness. Fundal tenderness and bilateral adnexal tenderness to the left adnexa appears to be more tender than the right.  Musculoskeletal: Normal range of motion. She exhibits tenderness. She exhibits no edema.  Bilateral CVA tenderness and lumbar paraspinal tenderness.  Neurological: She is alert and oriented to person, place, and time.  Moves all extremities without deficit. Sensation is fully intact.  Skin: Skin is warm and dry. No rash noted. No erythema.  Psychiatric: She has a normal mood and affect. Her behavior is normal.  Nursing note and vitals reviewed.   ED Course  Procedures (including critical care time) Labs Review Labs Reviewed  COMPREHENSIVE METABOLIC PANEL - Abnormal; Notable for the following:    Total Bilirubin 0.1 (*)    All other components within normal limits  CBC - Abnormal; Notable for the following:    WBC 11.8 (*)    All other components within normal limits  URINALYSIS, ROUTINE W REFLEX MICROSCOPIC (NOT AT [facility]) - Abnormal; Notable for the following:    Color, Urine ORANGE (*)    APPearance CLOUDY (*)    Nitrite POSITIVE (*)    All other components within normal limits  URINE MICROSCOPIC-ADD ON - Abnormal; Notable for the following:    Squamous Epithelial / LPF 6-30 (*)    Bacteria, UA RARE (*)    All other components within normal limits  I-STAT BETA HCG BLOOD, ED ([facility], [facility], [facility] ONLY) - Abnormal; Notable for the following:    I-stat hCG, quantitative 10.1 (*)    All other components within normal limits  URINE CULTURE   WET PREP, GENITAL  LIPASE, BLOOD  I-STAT CG4 LACTIC ACID, ED  GC/CHLAMYDIA PROBE AMP ([place]) NOT AT [facility]    Imaging Review No results found. I have personally reviewed and evaluated these images and lab results as part of my medical decision-making.   EKG Interpretation   Date/Time:  Tuesday October 20 2015 22:43:07 EDT Ventricular Rate:  90 PR Interval:    QRS Duration: 91 QT Interval:  335 QTC Calculation: 410 R Axis:   38 Text Interpretation:  Sinus rhythm Confirmed by Ranae PalmsYELVERTON  MD, Delvonte Berenson  ([id number]) on 10/20/2015 10:51:11 PM      MDM   Final diagnoses:  None    Concern for retained products and endometritis versus PID. Ultrasound pelvis and given IV fluids. Urine culture inconclusive on previous visit.    Loren Raceravid Sherin Murdoch, MD 10/20/15 2251

## 2015-10-20 NOTE — ED Notes (Signed)
Dr  Yelverton in room.  

## 2015-10-20 NOTE — ED Notes (Signed)
Patient transported to Ultrasound 

## 2015-10-21 LAB — GC/CHLAMYDIA PROBE AMP (~~LOC~~) NOT AT ARMC
CHLAMYDIA, DNA PROBE: NEGATIVE
Neisseria Gonorrhea: NEGATIVE

## 2015-10-21 LAB — WET PREP, GENITAL
Sperm: NONE SEEN
TRICH WET PREP: NONE SEEN
YEAST WET PREP: NONE SEEN

## 2015-10-21 MED ORDER — AZITHROMYCIN 250 MG PO TABS
1000.0000 mg | ORAL_TABLET | Freq: Once | ORAL | Status: AC
  Administered 2015-10-21: 1000 mg via ORAL
  Filled 2015-10-21: qty 4

## 2015-10-21 MED ORDER — DEXTROSE 5 % IV SOLN
250.0000 mg | Freq: Once | INTRAVENOUS | Status: AC
  Administered 2015-10-21: 250 mg via INTRAVENOUS
  Filled 2015-10-21: qty 250

## 2015-10-21 MED ORDER — CEFTRIAXONE SODIUM 250 MG IJ SOLR: 250.0000 mg | Freq: Once | INTRAMUSCULAR | Status: DC

## 2015-10-21 MED ORDER — DOXYCYCLINE HYCLATE 100 MG PO CAPS: 100.0000 mg | ORAL_CAPSULE | Freq: Two times a day (BID) | ORAL | Status: DC

## 2015-10-21 MED ORDER — HYDROCODONE-ACETAMINOPHEN 5-325 MG PO TABS: 1.0000 | ORAL_TABLET | Freq: Four times a day (QID) | ORAL | Status: DC | PRN

## 2015-10-21 MED ORDER — IBUPROFEN 600 MG PO TABS: 600.0000 mg | ORAL_TABLET | Freq: Four times a day (QID) | ORAL | Status: DC | PRN

## 2015-10-21 NOTE — ED Notes (Signed)
Patient Alert and oriented X4. Stable and ambulatory. Patient verbalized understanding of the discharge instructions.  Patient belongings were taken by the patient.  

## 2015-10-21 NOTE — Discharge Instructions (Signed)

## 2015-10-22 LAB — URINE CULTURE: CULTURE: NO GROWTH

## 2015-11-03 ENCOUNTER — Encounter (HOSPITAL_COMMUNITY): Payer: Self-pay

## 2015-11-03 ENCOUNTER — Emergency Department (HOSPITAL_COMMUNITY)
Admission: EM | Admit: 2015-11-03 | Discharge: 2015-11-03 | Disposition: A | Discharge status: Home / Self Care | Payer: Self-pay | Attending: Emergency Medicine | Admitting: Emergency Medicine

## 2015-11-03 ENCOUNTER — Emergency Department (HOSPITAL_COMMUNITY): Payer: Self-pay

## 2015-11-03 DIAGNOSIS — R102 Pelvic and perineal pain: Secondary | ICD-10-CM

## 2015-11-03 DIAGNOSIS — F17210 Nicotine dependence, cigarettes, uncomplicated: Secondary | ICD-10-CM | POA: Insufficient documentation

## 2015-11-03 DIAGNOSIS — N739 Female pelvic inflammatory disease, unspecified: Secondary | ICD-10-CM | POA: Insufficient documentation

## 2015-11-03 DIAGNOSIS — N730 Acute parametritis and pelvic cellulitis: Secondary | ICD-10-CM

## 2015-11-03 HISTORY — DX: Unspecified convulsions: R56.9

## 2015-11-03 LAB — URINALYSIS, ROUTINE W REFLEX MICROSCOPIC
Bilirubin Urine: NEGATIVE
Glucose, UA: NEGATIVE mg/dL
Hgb urine dipstick: NEGATIVE
Ketones, ur: NEGATIVE mg/dL
LEUKOCYTES UA: NEGATIVE
Nitrite: NEGATIVE
PROTEIN: NEGATIVE mg/dL
Specific Gravity, Urine: 1.02 (ref 1.005–1.030)
pH: 6 (ref 5.0–8.0)

## 2015-11-03 LAB — COMPREHENSIVE METABOLIC PANEL
ALK PHOS: 55 U/L (ref 38–126)
ALT: 20 U/L (ref 14–54)
AST: 21 U/L (ref 15–41)
Albumin: 3.9 g/dL (ref 3.5–5.0)
Anion gap: 7 (ref 5–15)
BUN: 10 mg/dL (ref 6–20)
CALCIUM: 9.4 mg/dL (ref 8.9–10.3)
CHLORIDE: 108 mmol/L (ref 101–111)
CO2: 25 mmol/L (ref 22–32)
CREATININE: 0.77 mg/dL (ref 0.44–1.00)
Glucose, Bld: 103 mg/dL — ABNORMAL HIGH (ref 65–99)
Potassium: 4.3 mmol/L (ref 3.5–5.1)
Sodium: 140 mmol/L (ref 135–145)
Total Bilirubin: 0.2 mg/dL — ABNORMAL LOW (ref 0.3–1.2)
Total Protein: 6.8 g/dL (ref 6.5–8.1)

## 2015-11-03 LAB — CBC
HCT: 40 % (ref 36.0–46.0)
Hemoglobin: 12.8 g/dL (ref 12.0–15.0)
MCH: 28.8 pg (ref 26.0–34.0)
MCHC: 32 g/dL (ref 30.0–36.0)
MCV: 89.9 fL (ref 78.0–100.0)
PLATELETS: 277 10*3/uL (ref 150–400)
RBC: 4.45 MIL/uL (ref 3.87–5.11)
RDW: 12.7 % (ref 11.5–15.5)
WBC: 11.7 10*3/uL — AB (ref 4.0–10.5)

## 2015-11-03 LAB — WET PREP, GENITAL
CLUE CELLS WET PREP: NONE SEEN
Sperm: NONE SEEN
Trich, Wet Prep: NONE SEEN
YEAST WET PREP: NONE SEEN

## 2015-11-03 LAB — LIPASE, BLOOD: LIPASE: 42 U/L (ref 11–51)

## 2015-11-03 MED ORDER — STERILE WATER FOR INJECTION IJ SOLN
INTRAMUSCULAR | Status: AC
  Administered 2015-11-03: 10 mL
  Filled 2015-11-03: qty 10

## 2015-11-03 MED ORDER — IBUPROFEN 600 MG PO TABS: 600.0000 mg | ORAL_TABLET | Freq: Four times a day (QID) | ORAL | 0 refills | Status: DC | PRN

## 2015-11-03 MED ORDER — AZITHROMYCIN 250 MG PO TABS
1000.0000 mg | ORAL_TABLET | Freq: Once | ORAL | Status: AC
Start: 2015-11-03 — End: 2015-11-03
  Administered 2015-11-03: 1000 mg via ORAL
  Filled 2015-11-03: qty 4

## 2015-11-03 MED ORDER — METRONIDAZOLE 500 MG PO TABS: 500.0000 mg | ORAL_TABLET | Freq: Two times a day (BID) | ORAL | 0 refills | Status: DC

## 2015-11-03 MED ORDER — CEFTRIAXONE SODIUM 250 MG IJ SOLR
250.0000 mg | Freq: Once | INTRAMUSCULAR | Status: AC
  Administered 2015-11-03: 250 mg via INTRAMUSCULAR
  Filled 2015-11-03: qty 250

## 2015-11-03 MED ORDER — IBUPROFEN 800 MG PO TABS
800.0000 mg | ORAL_TABLET | Freq: Once | ORAL | Status: AC
  Administered 2015-11-03: 800 mg via ORAL
  Filled 2015-11-03: qty 1

## 2015-11-03 MED ORDER — HYDROCODONE-ACETAMINOPHEN 5-325 MG PO TABS
2.0000 | ORAL_TABLET | Freq: Once | ORAL | Status: AC
  Administered 2015-11-03: 2 via ORAL
  Filled 2015-11-03: qty 2

## 2015-11-03 MED ORDER — HYDROCODONE-ACETAMINOPHEN 5-325 MG PO TABS: 1.0000 | ORAL_TABLET | ORAL | 0 refills | Status: DC | PRN

## 2015-11-03 NOTE — ED Triage Notes (Signed)
Per PT, Pt is coming from home. Pt has HX of PID on the 7/18 and was sent home and antibiotics. Pt reports finishing antibiotics. Having period last Thursday through Saturday. Starting Saturday, pt started to have a dark brown discharge that was thick. Pt reports this morning, Pt started to have RLQ and LLQ abdominal pain with lower back pain that radiates to the mid back. Denies any burning with urination. Reports chills.

## 2015-11-03 NOTE — ED Provider Notes (Signed)
[facility]-EMER[facility]ENCY DEPT Provider Note   CSN: [id number] Arrival date & time: 11/03/15  1455  First Provider Contact:  None       History   Chief Complaint Chief Complaint  Patient presents with  . Abdominal Pain    HPI Jill Daniel is a 25 y.o. female.  HPI Patient was seen approximately 2 weeks ago for pelvic pain. At that time she was status post spontaneous miscarriage approximately a month earlier. Patient has been having lower abdominal pain that was slightly worse on the right than the left. She reports that she was treated with antibiotics for suspected PID or possibly retained products of conception. She reports 4 days ago she started to have a menstrual cycle with bleeding. She reports that she then had a gush of very dark blood. She reports that the pain has increased. She is also experiencing general lower back pain with this. Pain is worse with movement. No pain burning or urgency with urination. No vomiting or diarrhea. Past Medical History:  Diagnosis Date  . Anxiety   . Bipolar 1 disorder (HCC)   . Seizures (HCC)     There are no active problems to display for this patient.   Past Surgical History:  Procedure Laterality Date  . ADENOIDECTOMY    . tailbone    . TONSILLECTOMY    . WISDOM TOOTH EXTRACTION      OB History    No data available       Home Medications    Prior to Admission medications   Medication Sig Start Date End Date Taking? Authorizing Provider  acetaminophen (TYLENOL) 500 MG tablet Take 1,000 mg by mouth every 6 (six) hours as needed (for migraine).   Yes Historical Provider, MD  B Complex Vitamins (B COMPLEX 1 PO) Take 1 mg by mouth 4 (four) times daily. Liquid   Yes Historical Provider, MD  busPIRone (BUSPAR) 10 MG tablet Take 1 tablet (10 mg total) by mouth 2 (two) times daily. 10/09/15  Yes Antony Madura, PA-C  divalproex (DEPAKOTE ER) 500 MG 24 hr tablet Take 500 mg by mouth 2 (two) times daily.   Yes Historical Provider, MD    HYDROcodone-acetaminophen (NORCO) 5-325 MG tablet Take 1 tablet by mouth every 6 (six) hours as needed for severe pain. 10/21/15  Yes Loren Racer, MD  Lurasidone HCl (LATUDA) 60 MG TABS Take 60 mg by mouth every evening. 10/09/15  Yes Antony Madura, PA-C  Multiple Vitamins-Minerals (ONE-A-DAY FOR HER VITACRAVES) CHEW Chew 1 tablet by mouth daily.   Yes Historical Provider, MD  doxycycline (VIBRAMYCIN) 100 MG capsule Take 1 capsule (100 mg total) by mouth 2 (two) times daily. One po bid x 10 days Patient not taking: Reported on 11/03/2015 10/21/15   Loren Racer, MD  HYDROcodone-acetaminophen (NORCO/VICODIN) 5-325 MG tablet Take 1-2 tablets by mouth every 4 (four) hours as needed for moderate pain or severe pain. 11/03/15   Arby Barrette, MD  ibuprofen (ADVIL,MOTRIN) 600 MG tablet Take 1 tablet (600 mg total) by mouth every 6 (six) hours as needed. Patient not taking: Reported on 11/03/2015 10/21/15   Loren Racer, MD  ibuprofen (ADVIL,MOTRIN) 600 MG tablet Take 1 tablet (600 mg total) by mouth every 6 (six) hours as needed. 11/03/15   Arby Barrette, MD  metroNIDAZOLE (FLAGYL) 500 MG tablet Take 1 tablet (500 mg total) by mouth 2 (two) times daily. One po bid x 7 days 11/03/15   Arby Barrette, MD  phenazopyridine (PYRIDIUM) 200 MG tablet Take  1 tablet (200 mg total) by mouth 3 (three) times daily. Patient not taking: Reported on 11/03/2015 10/18/15   Barrett Henle, PA-C    Family History No family history on file.  Social History Social History  Substance Use Topics  . Smoking status: Current Some Day Smoker    Packs/day: 0.50    Types: Cigarettes  . Smokeless tobacco: Never Used  . Alcohol use No     Allergies   Decadron [dexamethasone] and Lactose intolerance (gi)   Review of Systems Review of Systems 10 Systems reviewed and are negative for acute change except as noted in the HPI.   Physical Exam Updated Vital Signs BP 121/73 (BP Location: Right Arm)   Pulse 75   Temp  98.5 F (36.9 C)   Resp 16   LMP 09/24/2015   SpO2 100%   Physical Exam  Constitutional: She is oriented to person, place, and time. She appears well-developed and well-nourished. No distress.  HENT:  Head: Normocephalic and atraumatic.  Eyes: EOM are normal. No scleral icterus.  Cardiovascular: Normal rate, regular rhythm, normal heart sounds and intact distal pulses.   Pulmonary/Chest: Effort normal and breath sounds normal.  Abdominal: Soft. Bowel sounds are normal. She exhibits no distension. There is tenderness.  Moderate to severe tenderness central lower abdomen and bilateral lower abdomen right slightly greater than left. No guarding or palpable mass. No CVA tenderness.  Genitourinary:  Genitourinary Comments: Normal external female genitalia. Patient was very tender with speculum exam. There is moderate grayish brown discharge in the vaginal vault. Severe cervical motion tenderness and general uterine and adnexal tenderness to palpation. No blood present.  Musculoskeletal: Normal range of motion. She exhibits no edema or tenderness.  Neurological: She is alert and oriented to person, place, and time. She exhibits normal muscle tone. Coordination normal.  Skin: Skin is warm and dry.  Psychiatric: She has a normal mood and affect.     ED Treatments / Results  Labs (all labs ordered are listed, but only abnormal results are displayed) Labs Reviewed  WET PREP, GENITAL - Abnormal; Notable for the following:       Result Value   WBC, Wet Prep HPF POC MODERATE (*)    All other components within normal limits  COMPREHENSIVE METABOLIC PANEL - Abnormal; Notable for the following:    Glucose, Bld 103 (*)    Total Bilirubin 0.2 (*)    All other components within normal limits  CBC - Abnormal; Notable for the following:    WBC 11.7 (*)    All other components within normal limits  LIPASE, BLOOD  URINALYSIS, ROUTINE W REFLEX MICROSCOPIC (NOT AT [facility]  GC/CHLAMYDIA PROBE AMP ([facility]) NOT AT [facility]    EKG  EKG Interpretation None       Radiology US Transvaginal Non-ob  Result Date: 11/03/2015 CLINICAL DATA:  25 year old female with increase generalized pelvic pain radiating to the back since July. Brown discharge. Treated for pelvic inflammatory disease in July. Subsequent encounter. EXAM: TRANSABDOMINAL AND TRANSVAGINAL ULTRASOUND OF PELVIS TECHNIQUE: Both transabdominal and transvaginal ultrasound examinations of the pelvis were performed. Transabdominal technique was performed for global imaging of the pelvis including uterus, ovaries, adnexal regions, and pelvic cul-de-sac. It was necessary to proceed with endovaginal exam following the transabdominal exam to visualize the ovaries. COMPARISON:  CT Abdomen and Pelvis 10/18/2015. Pelvis ultrasound 10/20/2015. FINDINGS: Uterus Measurements: 7.1 x 2.9 x 4.6 cm. No fibroids or other mass visualized. Bicornuate uterine configuration again noted. Endometrium  Thickness: 5 mm.  No focal abnormality visualized. Right ovary Measurements: 3.0 x 1.8 x 2.0 cm. Several small follicles. Normal appearance/no adnexal mass. Left ovary Measurements: 2.7 x 2.2 x 1.8 cm. Collapsing cyst or follicle suspected (image 120). Normal appearance/no adnexal mass. Other findings Trace simple appearing free fluid. IMPRESSION: Continued physiologic ultrasound appearance of the pelvis. Incidental bicornuate uterus. Electronically Signed   By: Odessa Fleming M.D.   On: 11/03/2015 21:27   US Pelvis Complete  Result Date: 11/03/2015 CLINICAL DATA:  25 year old female with increase generalized pelvic pain radiating to the back since July. Brown discharge. Treated for pelvic inflammatory disease in July. Subsequent encounter. EXAM: TRANSABDOMINAL AND TRANSVAGINAL ULTRASOUND OF PELVIS TECHNIQUE: Both transabdominal and transvaginal ultrasound examinations of the pelvis were performed. Transabdominal technique was performed for global imaging of the pelvis including  uterus, ovaries, adnexal regions, and pelvic cul-de-sac. It was necessary to proceed with endovaginal exam following the transabdominal exam to visualize the ovaries. COMPARISON:  CT Abdomen and Pelvis 10/18/2015. Pelvis ultrasound 10/20/2015. FINDINGS: Uterus Measurements: 7.1 x 2.9 x 4.6 cm. No fibroids or other mass visualized. Bicornuate uterine configuration again noted. Endometrium Thickness: 5 mm.  No focal abnormality visualized. Right ovary Measurements: 3.0 x 1.8 x 2.0 cm. Several small follicles. Normal appearance/no adnexal mass. Left ovary Measurements: 2.7 x 2.2 x 1.8 cm. Collapsing cyst or follicle suspected (image 120). Normal appearance/no adnexal mass. Other findings Trace simple appearing free fluid. IMPRESSION: Continued physiologic ultrasound appearance of the pelvis. Incidental bicornuate uterus. Electronically Signed   By: Odessa Fleming M.D.   On: 11/03/2015 21:27    Procedures Procedures (including critical care time)  Medications Ordered in ED Medications  cefTRIAXone (ROCEPHIN) injection 250 mg (not administered)  azithromycin (ZITHROMAX) tablet 1,000 mg (not administered)  ibuprofen (ADVIL,MOTRIN) tablet 800 mg (not administered)  HYDROcodone-acetaminophen (NORCO/VICODIN) 5-325 MG per tablet 2 tablet (not administered)  sterile water (preservative free) injection (not administered)     Initial Impression / Assessment and Plan / ED Course  I have reviewed the triage vital signs and the nursing notes.  Pertinent labs & imaging results that were available during my care of the patient were reviewed by me and considered in my medical decision making (see chart for details).  Clinical Course     Final Clinical Impressions(s) / ED Diagnoses   Final diagnoses:  Pelvic pain in female  PID (acute pelvic inflammatory disease)  Reports she had temporary improvement in pelvic pain after antibiotic therapy. She reports however the pain came back significantly. She does have  significant pelvic tenderness on bimanual exam. There is still grayish brown discharge present. Pelvic ultrasound does not show abscess or intrauterine contents. At this point, I still suspect pelvic etiology for patient's pain and will opt to treat for PID with IM Rocephin, Zithromax and 7 days of Flagyl. A consideration does have outpatient GYN follow-up. She is counseled if pain is worsening or she develops fever or other associated symptoms she is to return for reassessment.  New Prescriptions New Prescriptions   HYDROCODONE-ACETAMINOPHEN (NORCO/VICODIN) 5-325 MG TABLET    Take 1-2 tablets by mouth every 4 (four) hours as needed for moderate pain or severe pain.   IBUPROFEN (ADVIL,MOTRIN) 600 MG TABLET    Take 1 tablet (600 mg total) by mouth every 6 (six) hours as needed.   METRONIDAZOLE (FLAGYL) 500 MG TABLET    Take 1 tablet (500 mg total) by mouth 2 (two) times daily. One po bid x 7 days  Arby Barrette, MD 11/03/15 2217

## 2015-11-03 NOTE — ED Notes (Signed)
Pt to US.

## 2015-11-04 ENCOUNTER — Telehealth: Payer: Self-pay | Admitting: *Deleted

## 2015-11-04 ENCOUNTER — Encounter: Payer: Self-pay | Admitting: Obstetrics & Gynecology

## 2015-11-04 ENCOUNTER — Ambulatory Visit (INDEPENDENT_AMBULATORY_CARE_PROVIDER_SITE_OTHER): Payer: Self-pay | Admitting: Obstetrics & Gynecology

## 2015-11-04 VITALS — BP 124/75 | HR 84 | Temp 98.7°F | Ht 62.0 in | Wt 168.9 lb

## 2015-11-04 DIAGNOSIS — R102 Pelvic and perineal pain: Secondary | ICD-10-CM

## 2015-11-04 LAB — GC/CHLAMYDIA PROBE AMP (~~LOC~~) NOT AT ARMC
Chlamydia: NEGATIVE
NEISSERIA GONORRHEA: NEGATIVE
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
## 2015-11-04 NOTE — Telephone Encounter (Signed)
Received message left by patient on nurse voicemail on 11/03/15 at 1409.  Patient states she was recently diagnosed with PID.  States she was treated and has finished her medication and is having abdominal pain again.    Had registration staff contact patient.  Appointment given for today 11/04/15 at 1340.

## 2015-11-04 NOTE — Progress Notes (Signed)
States already has a Veterinary surgeon and just saw her counselor.

## 2015-11-11 ENCOUNTER — Encounter: Payer: Self-pay | Admitting: Family Medicine

## 2015-11-16 ENCOUNTER — Ambulatory Visit (INDEPENDENT_AMBULATORY_CARE_PROVIDER_SITE_OTHER): Payer: Self-pay | Admitting: Neurology

## 2015-11-16 ENCOUNTER — Encounter: Payer: Self-pay | Admitting: Neurology

## 2015-11-16 VITALS — BP 117/70 | HR 86 | Ht 62.0 in | Wt 174.2 lb

## 2015-11-16 DIAGNOSIS — IMO0001 Reserved for inherently not codable concepts without codable children: Secondary | ICD-10-CM

## 2015-11-16 DIAGNOSIS — F05 Delirium due to known physiological condition: Secondary | ICD-10-CM

## 2015-11-16 DIAGNOSIS — F410 Panic disorder [episodic paroxysmal anxiety] without agoraphobia: Secondary | ICD-10-CM | POA: Insufficient documentation

## 2015-11-16 DIAGNOSIS — R410 Disorientation, unspecified: Secondary | ICD-10-CM

## 2015-11-16 DIAGNOSIS — R404 Transient alteration of awareness: Secondary | ICD-10-CM

## 2015-11-16 DIAGNOSIS — F319 Bipolar disorder, unspecified: Secondary | ICD-10-CM | POA: Insufficient documentation

## 2015-11-16 DIAGNOSIS — R413 Other amnesia: Secondary | ICD-10-CM

## 2015-11-16 NOTE — Progress Notes (Signed)
[facility]    Provider:  Dr Lucia Gaskins Referring Provider: Gilda Crease, MD Primary Care Physician:  Gilda Crease, MD  CC:  seizures  HPI:  Jill Daniel is a 25 y.o. female here as a referral from Dr. Midge Aver for seizures, dizziness and memory loss. She reports that she has seizures that depend on her stress level and emotional level.Past medical history of bipolar disorder and patient has been off her medications recently just restarted in the last month. Previous concussions with history of loosing consciousness due to sports 4 times in the past. History of panic attacks since 2015. Patient is here with her grandmother. She has seizures that depend on her stress level and emotional level. She can have one or multiple during the day most days. Since taking the Depakote she has improved. She stares off into space, she loses time but she can hear people but she can;t register what they are saying to her, she canlt respond. Her heart beat gets very fast, her breathing gets very shallow. She feels confused. She has had a history of panic attack. Grandmother is here and has seen the attacks. She will just sit there and stare. They are brief. She will respond to her grandmother.  Episodes started after concussions and maybe since a teen. Patient has an abnormal uterus and cannot carry to term, did discuss Depakote can be teratogenic and she understands this. She had a level 4 concussion as a teenager and the episodes started after this.  She has the seizures every day but sometimes it is every other day. They are less than a minute. She gets confused of where she is at, what was said with conversations. Sometimes feels like nothing is registering in her brain. She is tired after the staring fell, she is confused, hot all over and she feels out of it. No tongue biting, urination, or convulsing. She does not drive, grandmother does drive her anywhere.  She has bipolar which is  not entriely treated, she is under a lot of stress and she has had concussions in the past. Her feet turn purple. From the calf down to the toes. Her feet get cold. Episodes happen every time she sits when her feet are dangling. When she has her feet up it is fine. She has scoliosis in her back. When it happens it feels like pins and needles on the feet.  She has significant memory loss, both short and long term.    Reviewed notes, labs and imaging from outside physicians, which showed: Reviewed notes from primary care. She was seen in the emergency room on 10/20/2015 for palpitations, chest pain and dizziness followed by feelings of fatigue, weakness and decreased awareness of surroundings. Reports that this occurs episodically for the past 2 years and has become progressively worse and frequent. Reports worsening of short-term and long-term memory loss and urinary incontinence during one episode. Denies tongue biting. She currently seen at [facility] for bipolar disorder. Reviewed exam including Gen. appearance, skin, head, eyes, ENT, respiratory, cardiovascular, musculoskeletal and psychiatric which were normal. She was started on Depakote extended release 500 mg tablets.   Reviewed notes in Epic. Patient was seen in the emergency room 10/09/2015. Patient reported that she has bipolar disorder and anxiety and she is trying to get back on her medications. She had run out of her medications since February 2017. She reported worsening agitation and anxiety as well as difficulty forming complete thoughts. Patient was also complaining of trouble  focusing. No suicidal or homicidal thoughts. She had difficulty getting her medications secondary to labs and insurance coverage. She recently moves to [place]. Exam noted that she was mildly anxious and mildly agitated. She requested psychiatric medications. She was being followed by psychiatry in [place] and had been off her medications. Medications were refilled  for 1 month and she was referred out to psychiatry.   BUN 10, creatinine 0.77 on 11/03/2015  Review of Systems: Patient complains of symptoms per HPI as well as the following symptoms: Fatigue, memory loss, confusion, headache, numbness, weakness, insomnia, sleepiness, restless legs, seizure, tremor, depression, anxiety, and too much sleep, not enough sleep, decreased energy, disinterest in activities, aching muscles. Pertinent negatives per HPI. All others negative.   Social History   Social History  . Marital status: Single    Spouse name: N/A  . Number of children: N/A  . Years of education: N/A   Occupational History  . Not on file.   Social History Main Topics  . Smoking status: Current Every Day Smoker    Packs/day: 0.50    Types: Cigarettes  . Smokeless tobacco: Never Used  . Alcohol use No     Comment: Socially  . Drug use: No  . Sexual activity: No   Other Topics Concern  . Not on file   Social History Narrative   Lives with grandma   Caffeine use: tea/coffee 2x/day    Family History  Problem Relation Age of Onset  . Depression Mother   . Depression Sister     Past Medical History:  Diagnosis Date  . Anxiety   . Bipolar 1 disorder (HCC)   . Seizures (HCC)     Past Surgical History:  Procedure Laterality Date  . ADENOIDECTOMY    . tailbone    . TONSILLECTOMY    . WISDOM TOOTH EXTRACTION      Current Outpatient Prescriptions  Medication Sig Dispense Refill  . acetaminophen (TYLENOL) 500 MG tablet Take 1,000 mg by mouth every 6 (six) hours as needed (for migraine).    . B Complex Vitamins (B COMPLEX 1 PO) Take 1 mg by mouth 4 (four) times daily. Liquid    . busPIRone (BUSPAR) 10 MG tablet Take 1 tablet (10 mg total) by mouth 2 (two) times daily. 60 tablet 0  . divalproex (DEPAKOTE ER) 500 MG 24 hr tablet Take 500 mg by mouth 2 (two) times daily.    Marland Kitchen. ibuprofen (ADVIL,MOTRIN) 600 MG tablet Take 1 tablet (600 mg total) by mouth every 6 (six) hours  as needed. 30 tablet 0  . Lurasidone HCl (LATUDA) 60 MG TABS Take 60 mg by mouth every evening. 30 tablet 0  . metroNIDAZOLE (FLAGYL) 500 MG tablet Take 1 tablet (500 mg total) by mouth 2 (two) times daily. One po bid x 7 days 14 tablet 0  . Multiple Vitamins-Minerals (ONE-A-DAY FOR HER VITACRAVES) CHEW Chew 1 tablet by mouth daily.     No current facility-administered medications for this visit.     Allergies as of 11/16/2015 - Review Complete 11/16/2015  Allergen Reaction Noted  . Decadron [dexamethasone]  10/09/2015  . Lactose intolerance (gi) Diarrhea and Nausea And Vomiting 10/20/2015    Vitals: BP 117/70 (BP Location: Right Arm, Patient Position: Sitting, Cuff Size: Normal)   Pulse 86   Ht 5\' 2"  (1.575 m)   Wt 174 lb 3.2 oz (79 kg)   LMP 09/24/2015   BMI 31.86 kg/m  Last Weight:  Wt Readings from Last  1 Encounters:  11/16/15 174 lb 3.2 oz (79 kg)   Last Height:   Ht Readings from Last 1 Encounters:  11/16/15 5\' 2"  (1.575 m)   Physical exam: Exam: Gen: NAD, conversant, well nourised, obese, well groomed                     CV: RRR, no MRG. No Carotid Bruits. No peripheral edema, warm, nontender Eyes: Conjunctivae clear without exudates or hemorrhage  Neuro: Detailed Neurologic Exam  Speech:    Speech is normal; fluent and spontaneous with normal comprehension.  Cognition:    The patient is oriented to person, place, and time;     recent and remote memory intact;     language fluent;     normal attention, concentration,     fund of knowledge Cranial Nerves:    The pupils are equal, round, and reactive to light. The fundi are normal and spontaneous venous pulsations are present. Visual fields are full to finger confrontation. Extraocular movements are intact. Trigeminal sensation is intact and the muscles of mastication are normal. The face is symmetric. The palate elevates in the midline. Hearing intact. Voice is normal. Shoulder shrug is normal. The tongue has  normal motion without fasciculations.   Coordination:    Normal finger to nose and heel to shin. Normal rapid alternating movements.   Gait:    Heel-toe and tandem gait are normal.   Motor Observation:    No asymmetry, no atrophy, and no involuntary movements noted. Tone:    Normal muscle tone.    Posture:    Posture is normal. normal erect    Strength:    Strength is V/V in the upper and lower limbs.      Sensation: intact to LT     Reflex Exam:  DTR's:    Deep tendon reflexes in the upper and lower extremities are normal bilaterally.   Toes:    The toes are downgoing bilaterally.   Clonus:    Clonus is absent.       Assessment/Plan:   25 y.o. female here as a referral from Dr. Midge AverPavelock for seizures, dizziness and memory loss. Significant psychiatric history of bipolar disorder which they are trying to treat but not well controlled per patient and grandmother (recently restarted on medications within the last month), previous reported concussions, histtory of panic attacks since 2015. Patient is here with her grandmother who reports she also has seizures.  Episodes are reported as brief staring spells. She has never been formally evaluated for these staring spells. She has the spells often and they are influenced heavily by stress and emotional level per patient. Patient was started on Depakote by pcp. 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  Episodes of staring spell: I hesitate to call these seizures at this point without confirmation/workup. Given the fact that these episodes are highly influenced by her stress and emotional levels there is always a possibility of nonepileptic events. She can continue Depakote at this time. Grandmother wanted patient switched to Topamax for her seizures, again  at this point I would hesitate to diagnose her with seizures. In addition, Topamax will worsen depression and memory loss which are 2 of patient's other complaints. If workup does confirm that these episodes are seizures, Lamictal may be a good choice for  patient as this is also used and bipolar disorder but would have to discuss with psychiatrist. Need MRI of the brain and EEG. If routine EEG negative then may consider ambulatory extended EEG to catch an episode.  Memory loss: Patient has significant psychiatric problems that are not currently well treated per grandmother and patient. These likely contributing to reported memory loss. Patient also has a history of concussions which could be contributing. At this time is very difficult to differentiate between the 2 and I advised them to continue managing her bipolar symptoms. Can check her thyroid, B12 and folate, RPR and HIV.  Paresthesias in the feet: positional and symptoms resolve with changes in sitting or standing, at this point no workup.  CC: Pavelock, Duke Salvia, MD  Naomie Dean, MD  [facility] [address] [place], [place] [address]  Phone [phone] Fax [phone]

## 2015-11-16 NOTE — Patient Instructions (Addendum)
Remember to drink plenty of fluid, eat healthy meals and do not skip any meals. Try to eat protein with a every meal and eat a healthy snack such as fruit or nuts in between meals. Try to keep a regular sleep-wake schedule and try to exercise daily, particularly in the form of walking, 20-30 minutes a day, if you can.   As far as your medications are concerned, I would like to suggest: You have not been diagnose with seizures, this is something we are evaluating.There is also something called non-epileptic events we need to evaluate for.  If you are diagnosed with seizures, I may recommend Lamictal but would have to discuss with your psychiatrist.   As far as diagnostic testing: eeg, mri brain, labs  I would like to see you back in 3 months, sooner if we need to. Please call us with any interim questions, concerns, problems, updates or refill requests.    Our phone number is [phone]. We also have an after hours call service for urgent matters and there is a physician on-call for urgent questions. For any emergencies you know to call 911 or go to the nearest emergency room

## 2015-11-17 LAB — THYROID PANEL WITH TSH
Free Thyroxine Index: 2 (ref 1.2–4.9)
T3 UPTAKE RATIO: 30 % (ref 24–39)
T4 TOTAL: 6.6 ug/dL (ref 4.5–12.0)
TSH: 1.03 u[IU]/mL (ref 0.450–4.500)

## 2015-11-17 LAB — HIV ANTIBODY (ROUTINE TESTING W REFLEX): HIV Screen 4th Generation wRfx: NONREACTIVE

## 2015-11-17 LAB — B12 AND FOLATE PANEL: VITAMIN B 12: 591 pg/mL (ref 211–946)

## 2015-11-17 LAB — RPR: RPR Ser Ql: NONREACTIVE

## 2015-11-18 ENCOUNTER — Telehealth: Payer: Self-pay | Admitting: *Deleted

## 2015-11-18 NOTE — Telephone Encounter (Signed)
-----   Message from Anson FretAntonia B Ahern, MD sent at 11/17/2015  5:51 PM EDT ----- Labs all normal thanks

## 2015-11-18 NOTE — Telephone Encounter (Signed)
LVM for pt about normal labs per Dr Ahern note. Gave [facility] phone number if she has further questions. 

## 2015-12-09 ENCOUNTER — Ambulatory Visit (INDEPENDENT_AMBULATORY_CARE_PROVIDER_SITE_OTHER): Payer: Self-pay | Admitting: Neurology

## 2015-12-09 ENCOUNTER — Telehealth: Payer: Self-pay | Admitting: *Deleted

## 2015-12-09 DIAGNOSIS — R410 Disorientation, unspecified: Secondary | ICD-10-CM

## 2015-12-09 DIAGNOSIS — R413 Other amnesia: Secondary | ICD-10-CM

## 2015-12-09 DIAGNOSIS — F05 Delirium due to known physiological condition: Secondary | ICD-10-CM

## 2015-12-09 DIAGNOSIS — IMO0001 Reserved for inherently not codable concepts without codable children: Secondary | ICD-10-CM

## 2015-12-09 DIAGNOSIS — R404 Transient alteration of awareness: Secondary | ICD-10-CM

## 2015-12-09 NOTE — Procedures (Signed)
    History:  Jill Daniel is a 25 year old patient with a history of dizziness, memory loss, and seizures. Seizures may be brought on by stress. The patient has a past history for bipolar disorder. The events are associated with staring off into space, losing track of time. The patient may hear people but she is unable to respond. The patient will have an elevated heart rate during the events. She is being evaluated for these episodes.  This is a routine EEG. No skull defects are noted. Medications include Tylenol, B complex vitamins, BuSpar, Depakote, Latuda, multivitamins, and Flagyl.   EEG classification: Normal awake and drowsy  Description of the recording: The background rhythms of this recording consists of a fairly well modulated medium amplitude alpha rhythm of 9 Hz that is reactive to eye opening and closure. As the record progresses, the patient appears to remain in the waking state throughout the recording. Photic stimulation was performed, resulting in a bilateral and symmetric photic driving response. Hyperventilation was also performed, resulting in a minimal buildup of the background rhythm activities without significant slowing seen. Toward the end of the recording, the patient enters the drowsy state with slight symmetric slowing seen. The patient never enters stage II sleep. At no time during the recording does there appear to be evidence of spike or spike wave discharges or evidence of focal slowing. EKG monitor shows no evidence of cardiac rhythm abnormalities with a heart rate of 84.  Impression: This is a normal EEG recording in the waking and drowsy state. No evidence of ictal or interictal discharges are seen.

## 2015-12-09 NOTE — Telephone Encounter (Signed)
-----   Message from Anson FretAntonia B Ahern, MD sent at 12/09/2015  2:14 PM EDT ----- EEG normal thanks. We can order a prolonged 24-72 hour eeg thanks

## 2015-12-09 NOTE — Telephone Encounter (Signed)
We can hold off on the 3-day eeg for now thanks

## 2015-12-09 NOTE — Telephone Encounter (Signed)
Dr Lucia GaskinsAhern- please advise  Called and spoke to pt about normal EEG results. Pt agreeable to go forward with 3 day EEG. She requested I also call her grandmother, Elease Hashimotoatricia to discuss.   I called grandmother and relayed results. She verbalized understanding, but they do not have insurance right now. She states they had to pay over 500 dollars for EEG today and wondering if 3 day if necessary. She also has MRI to be done. Advised I will speak with Dr Lucia GaskinsAhern and call her back to advise. She is wondering what the point is to do a 3 day EEG if 1 hour came back normal. She is wondering why we did not send her for a 3 day to begin with.

## 2015-12-09 NOTE — Telephone Encounter (Signed)
Called grandmother back. Advised Dr Lucia GaskinsAhern stated they can hold off on 3 day EEG for now. She still recommends patient to have MRI. She verbalized understanding.  She also stated she called [facility] with number I gave her and they told her out of pocket without insurance will cost $2500.   Gave number to [facility] so they can call and schedule MRI, [phone]. Advised her to call back if they have any further questions.

## 2015-12-31 ENCOUNTER — Ambulatory Visit
Admission: RE | Admit: 2015-12-31 | Discharge: 2015-12-31 | Disposition: A | Discharge status: Home / Self Care | Payer: No Typology Code available for payment source | Source: Ambulatory Visit | Attending: Neurology | Admitting: Neurology

## 2015-12-31 DIAGNOSIS — R404 Transient alteration of awareness: Secondary | ICD-10-CM

## 2015-12-31 DIAGNOSIS — IMO0001 Reserved for inherently not codable concepts without codable children: Secondary | ICD-10-CM

## 2015-12-31 DIAGNOSIS — R410 Disorientation, unspecified: Secondary | ICD-10-CM

## 2015-12-31 DIAGNOSIS — F05 Delirium due to known physiological condition: Secondary | ICD-10-CM

## 2015-12-31 DIAGNOSIS — R413 Other amnesia: Secondary | ICD-10-CM

## 2015-12-31 MED ORDER — GADOBENATE DIMEGLUMINE 529 MG/ML IV SOLN
16.0000 mL | Freq: Once | INTRAVENOUS | Status: AC | PRN
  Administered 2015-12-31: 16 mL via INTRAVENOUS

## 2016-01-04 ENCOUNTER — Telehealth: Payer: Self-pay | Admitting: *Deleted

## 2016-01-04 NOTE — Telephone Encounter (Signed)
-----   Message from Anson FretAntonia B Ahern, MD sent at 01/03/2016  7:01 PM EDT ----- MRI of the brain is normal. Incidentally there seems to be a little fluid in the right inner ear thanks.

## 2016-01-04 NOTE — Telephone Encounter (Signed)
LVM for patient about MRI results per Dr Lucia Gaskinsahern note. Gave [facility] phone number if she has further questions.

## 2016-01-21 ENCOUNTER — Encounter (HOSPITAL_COMMUNITY): Payer: Self-pay | Admitting: Emergency Medicine

## 2016-01-21 ENCOUNTER — Emergency Department (HOSPITAL_COMMUNITY)
Admission: EM | Admit: 2016-01-21 | Discharge: 2016-01-21 | Disposition: A | Discharge status: Home / Self Care | Payer: Self-pay | Attending: Emergency Medicine | Admitting: Emergency Medicine

## 2016-01-21 DIAGNOSIS — F17210 Nicotine dependence, cigarettes, uncomplicated: Secondary | ICD-10-CM | POA: Insufficient documentation

## 2016-01-21 DIAGNOSIS — R591 Generalized enlarged lymph nodes: Secondary | ICD-10-CM | POA: Insufficient documentation

## 2016-01-21 DIAGNOSIS — L0501 Pilonidal cyst with abscess: Secondary | ICD-10-CM | POA: Insufficient documentation

## 2016-01-21 LAB — COMPREHENSIVE METABOLIC PANEL
ALBUMIN: 4 g/dL (ref 3.5–5.0)
ALK PHOS: 58 U/L (ref 38–126)
ALT: 14 U/L (ref 14–54)
AST: 17 U/L (ref 15–41)
Anion gap: 7 (ref 5–15)
BILIRUBIN TOTAL: 0.6 mg/dL (ref 0.3–1.2)
CO2: 26 mmol/L (ref 22–32)
CREATININE: 0.77 mg/dL (ref 0.44–1.00)
Calcium: 9.3 mg/dL (ref 8.9–10.3)
Chloride: 105 mmol/L (ref 101–111)
GFR calc Af Amer: >60 mL/min (ref >60)
GFR calc non Af Amer: >60 mL/min (ref >60)
GLUCOSE: 93 mg/dL (ref 65–99)
POTASSIUM: 4.2 mmol/L (ref 3.5–5.1)
Sodium: 138 mmol/L (ref 135–145)
TOTAL PROTEIN: 6.8 g/dL (ref 6.5–8.1)

## 2016-01-21 LAB — CBC
HEMATOCRIT: 40.9 % (ref 36.0–46.0)
Hemoglobin: 13.6 g/dL (ref 12.0–15.0)
MCH: 29.4 pg (ref 26.0–34.0)
MCHC: 33.3 g/dL (ref 30.0–36.0)
MCV: 88.5 fL (ref 78.0–100.0)
Platelets: 276 10*3/uL (ref 150–400)
RBC: 4.62 MIL/uL (ref 3.87–5.11)
RDW: 12.7 % (ref 11.5–15.5)
WBC: 11.3 10*3/uL — ABNORMAL HIGH (ref 4.0–10.5)

## 2016-01-21 LAB — LIPASE, BLOOD: Lipase: 27 U/L (ref 11–51)

## 2016-01-21 LAB — URINALYSIS, ROUTINE W REFLEX MICROSCOPIC
GLUCOSE, UA: NEGATIVE mg/dL
Hgb urine dipstick: NEGATIVE
KETONES UR: NEGATIVE mg/dL
Leukocytes, UA: NEGATIVE
NITRITE: NEGATIVE
PH: 7 (ref 5.0–8.0)
Protein, ur: NEGATIVE mg/dL
Specific Gravity, Urine: 1.023 (ref 1.005–1.030)

## 2016-01-21 LAB — I-STAT BETA HCG BLOOD, ED (MC, WL, AP ONLY): I-stat hCG, quantitative: <5 m[IU]/mL (ref <5)

## 2016-01-21 MED ORDER — IBUPROFEN 800 MG PO TABS
800.0000 mg | ORAL_TABLET | Freq: Once | ORAL | Status: AC
  Administered 2016-01-21: 800 mg via ORAL
  Filled 2016-01-21: qty 1

## 2016-01-21 MED ORDER — ACETAMINOPHEN 500 MG PO TABS
1000.0000 mg | ORAL_TABLET | Freq: Once | ORAL | Status: AC
Start: 2016-01-21 — End: 2016-01-21
  Administered 2016-01-21: 1000 mg via ORAL
  Filled 2016-01-21: qty 2

## 2016-01-21 MED ORDER — LIDOCAINE HCL (PF) 1 % IJ SOLN
5.0000 mL | Freq: Once | INTRAMUSCULAR | Status: AC
  Administered 2016-01-21: 5 mL via INTRADERMAL
  Filled 2016-01-21: qty 5

## 2016-01-21 NOTE — ED Notes (Signed)
Placed patient into a gown 

## 2016-01-21 NOTE — ED Notes (Signed)
Pt ambulating to bathroom to give urine sample.

## 2016-01-21 NOTE — ED Triage Notes (Signed)
Pt states that she has a cyst that has been lanced before, that has come back.  It is located on tailbone.  Very painful and red but not open or oozing.  Also complaint of right lower quadrant pain. She states that she feels a knot under skin on right lower abdomen.

## 2016-01-21 NOTE — Discharge Instructions (Signed)
For pain control please take ibuprofen (also known as Motrin or Advil) 800mg  (this is normally 4 over the counter pills) 3 times a day  for 5 days. Take with food to minimize stomach irritation.  If you develop fever, have vomiting or if the swelling and redness starts spreading , return to the emergency room immediately for a recheck.  Do not hesitate to return to the emergency room for any new, worsening or concerning symptoms.  Please obtain primary care using resource guide below. Let them know that you were seen in the emergency room and that they will need to obtain records for further outpatient management.

## 2016-01-21 NOTE — ED Provider Notes (Signed)
[facility]EMER[facility]ENCY DEPT Provider Note   CSN: [id number] Arrival date & time: 01/21/16  1359     History   Chief Complaint Chief Complaint  Patient presents with  . Cyst  . Abdominal Pain    right side    HPI   Blood pressure 110/77, pulse 108, temperature 99.6 F (37.6 C), temperature source Oral, resp. rate 16, height  (1.575 m), weight 79.4 kg, last menstrual period 12/25/2015, SpO2 98 %.  Jill Daniel is a 25 y.o. female complaining of recurrent abscess, has been lanced several times in the left gluteus, started again 2 days ago. She is also reporting a tender right inguinal lymph node with no abdominal pain, nausea, vomiting, fever, abnormal vaginal discharge, rash in the perineal area.  Past Medical History:  Diagnosis Date  . Anxiety   . Bipolar 1 disorder (HCC)   . Seizures [facility])     Patient Active Problem List   Diagnosis Date Noted  . Bipolar I disorder (HCC) 11/16/2015  . Panic attacks 11/16/2015    Past Surgical History:  Procedure Laterality Date  . ADENOIDECTOMY    . tailbone    . TONSILLECTOMY    . WISDOM TOOTH EXTRACTION      OB History    Gravida Para Term Preterm AB Living   2       2     SAB TAB Ectopic Multiple Live Births   2               Home Medications    Prior to Admission medications   Medication Sig Start Date End Date Taking? Authorizing Provider  B Complex Vitamins (B COMPLEX 1 PO) Take 1 mg by mouth 4 (four) times daily. Liquid   Yes Historical Provider, MD  busPIRone (BUSPAR) 10 MG tablet Take 1 tablet (10 mg total) by mouth 2 (two) times daily. 10/09/15  Yes Antony Madura, PA-C  Lurasidone HCl (LATUDA) 60 MG TABS Take 60 mg by mouth every evening. 10/09/15  Yes Antony Madura, PA-C  Multiple Vitamins-Minerals (ONE-A-DAY FOR HER VITACRAVES) CHEW Chew 1 tablet by mouth daily.   Yes Historical Provider, MD  ibuprofen (ADVIL,MOTRIN) 600 MG tablet Take 1 tablet (600 mg total) by mouth every 6 (six) hours as needed. Patient not  taking: Reported on 01/21/2016 11/03/15   Arby Barrette, MD  metroNIDAZOLE (FLAGYL) 500 MG tablet Take 1 tablet (500 mg total) by mouth 2 (two) times daily. One po bid x 7 days Patient not taking: Reported on 01/21/2016 11/03/15   Arby Barrette, MD    Family History Family History  Problem Relation Age of Onset  . Depression Mother   . Depression Sister     Social History Social History  Substance Use Topics  . Smoking status: Current Every Day Smoker    Packs/day: 0.50    Types: Cigarettes  . Smokeless tobacco: Never Used  . Alcohol use No     Comment: Socially     Allergies   Decadron [dexamethasone]; Lactose intolerance (gi); and Grapefruit extract   Review of Systems Review of Systems  10 systems reviewed and found to be negative, except as noted in the HPI.   Physical Exam Updated Vital Signs BP 104/73   Pulse 83   Temp 98.6 F (37 C) (Oral)   Resp 18   Ht  (1.575 m)   Wt 79.4 kg   LMP 12/25/2015 (Approximate)   SpO2 98%   BMI 32.01 kg/m   Physical Exam  Constitutional: She is oriented to person, place, and time. She appears well-developed and well-nourished. No distress.  HENT:  Head: Normocephalic and atraumatic.  Mouth/Throat: Oropharynx is clear and moist.  Eyes: Conjunctivae and EOM are normal. Pupils are equal, round, and reactive to light.  Neck: Normal range of motion.  Cardiovascular: Normal rate, regular rhythm and intact distal pulses.   Pulmonary/Chest: Effort normal and breath sounds normal. No respiratory distress. She has no wheezes. She has no rales. She exhibits no tenderness.  Abdominal: Soft. She exhibits no distension and no mass. There is no tenderness. There is no rebound and no guarding. No hernia.  Musculoskeletal: Normal range of motion.  Lymphadenopathy:       Right: Inguinal adenopathy present.  Focal 2 cm right inguinal, mobile,  tender lymph node  Neurological: She is alert and oriented to person, place, and time.    Skin: She is not diaphoretic.     Psychiatric:  Flat affect  Nursing note and vitals reviewed.    ED Treatments / Results  Labs (all labs ordered are listed, but only abnormal results are displayed) Labs Reviewed  COMPREHENSIVE METABOLIC PANEL - Abnormal; Notable for the following:       Result Value   BUN <5 (*)    All other components within normal limits  CBC - Abnormal; Notable for the following:    WBC 11.3 (*)    All other components within normal limits  URINALYSIS, ROUTINE W REFLEX MICROSCOPIC (NOT AT [facility]) - Abnormal; Notable for the following:    Color, Urine AMBER (*)    APPearance CLOUDY (*)    Bilirubin Urine SMALL (*)    All other components within normal limits  LIPASE, BLOOD  I-STAT BETA HCG BLOOD, ED ([facility], [facility], [facility] ONLY)    EKG  EKG Interpretation None       Radiology No results found.  Procedures .Marland KitchenIncision and Drainage Date/Time: 01/21/2016 8:00 PM Performed by: Wynetta Emery Authorized by: Wynetta Emery   Consent:    Consent obtained:  Verbal   Consent given by:  Patient Location:    Type:  Abscess   Size:  3   Location:  Anogenital   Anogenital location:  Gluteal cleft Pre-procedure details:    Skin preparation:  Betadine Anesthesia (see MAR for exact dosages):    Anesthesia method:  None Procedure type:    Complexity:  Simple Procedure details:    Needle aspiration: no     Incision types:  Single straight   Incision depth:  Dermal   Scalpel blade:  11   Wound management:  Probed and deloculated and irrigated with saline   Drainage:  Bloody and purulent   Drainage amount:  Moderate   Wound treatment:  Wound left open   Packing materials:  None Post-procedure details:    Patient tolerance of procedure:  Tolerated well, no immediate complications   (including critical care time)  Medications Ordered in ED Medications  lidocaine (PF) (XYLOCAINE) 1 % injection 5 mL (5 mLs Intradermal Given 01/21/16 1753)  ibuprofen  (ADVIL,MOTRIN) tablet 800 mg (800 mg Oral Given 01/21/16 1805)  acetaminophen (TYLENOL) tablet 1,000 mg (1,000 mg Oral Given 01/21/16 1805)     Initial Impression / Assessment and Plan / ED Course  I have reviewed the triage vital signs and the nursing notes.  Pertinent labs & imaging results that were available during my care of the patient were reviewed by me and considered in my medical decision making (see chart for  details).  Clinical Course    Vitals:   01/21/16 1800 01/21/16 1830 01/21/16 1845 01/21/16 1857  BP: 115/64 (!) 94/49 109/64 104/73  Pulse: 90 78 75 83  Resp:      Temp:    98.6 F (37 C)  TempSrc:    Oral  SpO2: 98% 99% 100% 98%  Weight:      Height:        Medications  lidocaine (PF) (XYLOCAINE) 1 % injection 5 mL (5 mLs Intradermal Given 01/21/16 1753)  ibuprofen (ADVIL,MOTRIN) tablet 800 mg (800 mg Oral Given 01/21/16 1805)  acetaminophen (TYLENOL) tablet 1,000 mg (1,000 mg Oral Given 01/21/16 1805)    Jill Daniel is 25 y.o. 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 Lymph node tender, firm and mobile. Likely reactive. I and D performed with expression of large amount of peripheral material, patient given surgical referral as this is a recurrent issue for her. No signs of systemic infection. Advised ibuprofen and Tylenol for pain control.  Evaluation does not show pathology that would require ongoing emergent intervention or inpatient treatment. Pt is hemodynamically stable and mentating appropriately. Discussed findings and plan with patient/guardian, who agrees with care plan. All questions answered. Return precautions discussed and outpatient follow up given.      Final Clinical Impressions(s) / ED Diagnoses   Final diagnoses:  Pilonidal cyst with abscess  Lymphadenopathy    New Prescriptions Discharge Medication List as of 01/21/2016  7:06 PM       Wynetta Emery, PA-C 01/21/16 2002      Glynn Octave, MD 01/22/16 1610

## 2016-01-21 NOTE — ED Notes (Signed)
Dressed wound on pt. Getting dressed.

## 2016-02-16 ENCOUNTER — Ambulatory Visit: Payer: Self-pay | Admitting: Nurse Practitioner

## 2016-02-17 ENCOUNTER — Encounter: Payer: Self-pay | Admitting: Nurse Practitioner

## 2016-02-27 ENCOUNTER — Emergency Department (HOSPITAL_COMMUNITY): Payer: Self-pay

## 2016-02-27 ENCOUNTER — Emergency Department (HOSPITAL_COMMUNITY)
Admission: EM | Admit: 2016-02-27 | Discharge: 2016-02-27 | Disposition: A | Discharge status: Home / Self Care | Payer: Self-pay | Attending: Emergency Medicine | Admitting: Emergency Medicine

## 2016-02-27 ENCOUNTER — Encounter (HOSPITAL_COMMUNITY): Payer: Self-pay

## 2016-02-27 DIAGNOSIS — F17210 Nicotine dependence, cigarettes, uncomplicated: Secondary | ICD-10-CM | POA: Insufficient documentation

## 2016-02-27 DIAGNOSIS — M79601 Pain in right arm: Secondary | ICD-10-CM | POA: Insufficient documentation

## 2016-02-27 DIAGNOSIS — Z79899 Other long term (current) drug therapy: Secondary | ICD-10-CM | POA: Insufficient documentation

## 2016-02-27 MED ORDER — IBUPROFEN 400 MG PO TABS
600.0000 mg | ORAL_TABLET | Freq: Once | ORAL | Status: AC
  Administered 2016-02-27: 600 mg via ORAL
  Filled 2016-02-27: qty 1

## 2016-02-27 MED ORDER — DIAZEPAM 5 MG PO TABS
5.0000 mg | ORAL_TABLET | Freq: Once | ORAL | Status: AC
  Administered 2016-02-27: 5 mg via ORAL
  Filled 2016-02-27: qty 1

## 2016-02-27 MED ORDER — CYCLOBENZAPRINE HCL 10 MG PO TABS: 10.0000 mg | ORAL_TABLET | Freq: Two times a day (BID) | ORAL | 0 refills | Status: DC | PRN

## 2016-02-27 MED ORDER — NAPROXEN 500 MG PO TABS: 500.0000 mg | ORAL_TABLET | Freq: Two times a day (BID) | ORAL | 0 refills | Status: DC

## 2016-02-27 MED ORDER — KETOROLAC TROMETHAMINE 15 MG/ML IJ SOLN: 15.0000 mg | Freq: Once | INTRAMUSCULAR | Status: DC

## 2016-02-27 NOTE — ED Triage Notes (Addendum)
Pt presents with onset of R arm pain that began last night.  Pt reports having stabbing pain to R elbow then having numbness that radiates down to hand and is now going up arm.  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  Pt reports swelling to arm and fingers with redness.  Pt reports shortness of breath.

## 2016-02-27 NOTE — ED Notes (Signed)
EDP at bedside  

## 2016-02-27 NOTE — ED Notes (Signed)
Delay in lab draw,  Pt not in room 

## 2016-02-27 NOTE — Discharge Instructions (Signed)
Your arm pain today may be due to muscle strain or spasm. Your xray was negative for injury.   Use heat pack or ice as preferred over your shoulder and arm for comfort. Use gentle range of motion exercises to keep your arm moving. Take NSAIDs (ibuprofen, motrin, or aleve) for pain as needed.  Follow up with your doctor for re-evaluation early next week.   If you develop worsening pain associated with fever, difficulty bending your elbow, elbow joint swelling, or other worsening symptoms see your doctor or return to the emergency department for evaluation.

## 2016-02-27 NOTE — ED Provider Notes (Signed)
[facility] Provider Note   CSN: [id number] Arrival date & time: 02/27/16  1249     History   Chief Complaint Chief Complaint  Patient presents with  . Arm Pain    HPI Jill Daniel is a 25 y.o. female.  HPI Patient is a 25 year old female with past medical history of bipolar disorder and anxiety who presents with right arm pain. She reports she began having pain in her right elbow on going to bed last night. When she was explaining she felt like her entire right arm was numb from shoulder to fingertips. She is experiencing occasional shooting pains that reaching her elbow into her shoulder and to her fingers. The symptoms are worse with movement particularly extension of the elbow. Patient also feels like her arm is swollen and has changed colors to red and purple. Denies trauma or falls. 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 Denies fevers, chills or nausea, vomiting or other symptoms. She has not had any prior neck or arm surgery. She has not tried taking any medicine for her pain.  Past Medical History:  Diagnosis Date  . Anxiety   . Bipolar 1 disorder (HCC)   . Seizures [facility](HCC)     Patient Active Problem List   Diagnosis Date Noted  . Bipolar I disorder (HCC) 11/16/2015  . Panic attacks 11/16/2015    Past Surgical History:  Procedure Laterality Date  . ADENOIDECTOMY    . tailbone    . TONSILLECTOMY    . WISDOM TOOTH EXTRACTION      OB History    Gravida Para Term Preterm AB Living   2       2     SAB TAB Ectopic Multiple Live Births   2               Home Medications    Prior to Admission medications   Medication Sig Start Date End Date Taking? Authorizing Provider  B Complex Vitamins (B COMPLEX 1 PO) Take 1 mg by mouth 4 (four) times daily. Liquid   Yes Historical Provider, MD  busPIRone (BUSPAR) 10 MG tablet Take 1  tablet (10 mg total) by mouth 2 (two) times daily. 10/09/15  Yes Antony MaduraKelly Humes, PA-C  ibuprofen (ADVIL,MOTRIN) 600 MG tablet Take 1 tablet (600 mg total) by mouth every 6 (six) hours as needed. Patient taking differently: Take 600 mg by mouth every 6 (six) hours as needed for headache (or pain).  11/03/15  Yes Arby BarretteMarcy Pfeiffer, MD  Lurasidone HCl (LATUDA) 60 MG TABS Take 60 mg by mouth every evening. 10/09/15  Yes Antony MaduraKelly Humes, PA-C  Multiple Vitamins-Minerals (ONE-A-DAY FOR HER VITACRAVES) CHEW Chew 1 tablet by mouth daily.   Yes Historical Provider, MD  cyclobenzaprine (FLEXERIL) 10 MG tablet Take 1 tablet (10 mg total) by mouth 2 (two) times daily as needed for muscle spasms. 02/27/16   Isa RankinAnn B Ray Gervasi, MD  metroNIDAZOLE (FLAGYL) 500 MG tablet Take 1 tablet (500 mg total) by mouth 2 (two) times daily. One po bid x 7 days Patient not taking: Reported on 02/27/2016 11/03/15   Arby BarretteMarcy Pfeiffer, MD  naproxen (NAPROSYN) 500 MG tablet Take 1 tablet (500 mg total) by mouth 2 (two) times daily. 02/27/16   Isa RankinAnn B Savva Beamer, MD    Family History Family History  Problem Relation Age of Onset  .  Depression Mother   . Depression Sister     Social History Social History  Substance Use Topics  . Smoking status: Current Every Day Smoker    Packs/day: 0.50    Types: Cigarettes  . Smokeless tobacco: Never Used  . Alcohol use No     Comment: Socially     Allergies   Decadron [dexamethasone]; Lactose intolerance (gi); Grapefruit extract; and Tape   Review of Systems Review of Systems  Constitutional: Negative for chills and fever.  HENT: Negative for ear pain and sore throat.   Eyes: Negative for pain and visual disturbance.  Respiratory: Negative for cough and shortness of breath.   Cardiovascular: Negative for chest pain and palpitations.  Gastrointestinal: Negative for abdominal pain and vomiting.  Genitourinary: Negative for dysuria and hematuria.  Musculoskeletal: Negative for back pain, neck pain and neck  stiffness.  Skin: Negative for rash and wound.  Neurological: Positive for weakness and numbness. Negative for seizures and syncope.       Paresthesias in right arm  All other systems reviewed and are negative.    Physical Exam Updated Vital Signs BP 119/75   Pulse 66   Temp 98.5 F (36.9 C) (Oral)   Resp 16   Ht 5' 2.5" (1.588 m)   Wt 77.1 kg   LMP 02/27/2016   SpO2 95%   BMI 30.60 kg/m   Physical Exam  Constitutional: She appears well-developed and well-nourished. No distress.  HENT:  Head: Normocephalic and atraumatic.  Eyes: Conjunctivae are normal.  Neck: Neck supple.  Cardiovascular: Normal rate and regular rhythm.   No murmur heard. Pulmonary/Chest: Effort normal and breath sounds normal. No respiratory distress.  Abdominal: Soft. There is no tenderness.  Musculoskeletal:  No swelling, deformity or color change of right upper extremity. Patient is holding her right arm in elbow flexion. Will not perform active range of motion due to pain. Able to perform passive range of motion but complains of pain in her elbow. Able to perform small movements, and all fingers. At least 3/5 strength (when arm is raised, able to hold it in the air without difficulty), limited due to pain. Right elbow is tender to palpation over the lateral and medial condyles. TTP over superior right shoulder and right upper trapezius muscles. Reports decreased sensation to light touch throughout right arm. Able to perform FROM of neck without midline cervical spinal tenderness.   Neurological: She is alert.  Skin: Skin is warm and dry.  Psychiatric:  Appears anxious  Nursing note and vitals reviewed.    ED Treatments / Results  Labs (all labs ordered are listed, but only abnormal results are displayed) Labs Reviewed - No data to display  EKG  EKG Interpretation None       Radiology Dg Elbow Complete Right  Result Date: 02/27/2016 CLINICAL DATA:  Acute right elbow pain without known  injury. EXAM: RIGHT ELBOW - COMPLETE 3+ VIEW COMPARISON:  None. FINDINGS: There is no evidence of fracture, dislocation, or joint effusion. There is no evidence of arthropathy or other focal bone abnormality. Soft tissues are unremarkable. IMPRESSION: Normal right elbow. Electronically Signed   By: Lupita Raider, M.D.   On: 02/27/2016 16:49    Procedures Procedures (including critical care time)  Medications Ordered in ED Medications  ibuprofen (ADVIL,MOTRIN) tablet 600 mg (600 mg Oral Given 02/27/16 1707)  diazepam (VALIUM) tablet 5 mg (5 mg Oral Given 02/27/16 1707)     Initial Impression / Assessment and Plan / ED  Course  I have reviewed the triage vital signs and the nursing notes.  Pertinent labs & imaging results that were available during my care of the patient were reviewed by me and considered in my medical decision making (see chart for details).  Clinical Course     Patient is a 25 year old female past history as above who presents with entire right arm pain and numbness. No history of trauma. No fevers, joint swelling, erythema or other signs of infection on exam. No neck pain or stiffness. Patient's paresthesias do not fit a specific nerve distribution. Distal extremity is warm and well perfused. Doubt acute pathology such as septic joint, fracture, brachial plexus injury or other emergent pathology. We'll obtain x-ray of the right elbow to rule out injury. NSAIDs given for pain. Valium po given for muscle spasm. X-rays negative for effusion, fracture or other acute findings. On reevaluation after medications patient's symptoms have significantly improved. She is able to perform ROM of right elbow and  move her right hand. She reports improved strength and sensation. Symptoms likely due to muscle spasm from overuse injury. Placed in sling for comfort. She was discharged in stable condition. Advised take NSAIDs, use heat or ice and use gentle stretching exercises. Work note given.  Rx for small amount of Flexeril given, and medication precautions discussed. Patient will follow up with PCP for reevaluation.  Patient seen and discussed with Dr. Clarene DukeLittle, ED attending   Final Clinical Impressions(s) / ED Diagnoses   Final diagnoses:  Right arm pain    New Prescriptions Discharge Medication List as of 02/27/2016  6:36 PM       Isa RankinAnn B Quatavious Rossa, MD 02/28/16 0112    Laurence Spatesachel Morgan Little, MD 02/28/16 [phone]

## 2016-10-05 ENCOUNTER — Ambulatory Visit (HOSPITAL_COMMUNITY)
Admission: EM | Admit: 2016-10-05 | Discharge: 2016-10-05 | Disposition: A | Discharge status: Home / Self Care | Payer: Self-pay | Attending: Family Medicine | Admitting: Family Medicine

## 2016-10-05 ENCOUNTER — Encounter (HOSPITAL_COMMUNITY): Payer: Self-pay | Admitting: Emergency Medicine

## 2016-10-05 DIAGNOSIS — Z888 Allergy status to other drugs, medicaments and biological substances status: Secondary | ICD-10-CM | POA: Insufficient documentation

## 2016-10-05 DIAGNOSIS — N730 Acute parametritis and pelvic cellulitis: Secondary | ICD-10-CM

## 2016-10-05 DIAGNOSIS — R1115 Cyclical vomiting syndrome unrelated to migraine: Secondary | ICD-10-CM

## 2016-10-05 DIAGNOSIS — G43A0 Cyclical vomiting, not intractable: Secondary | ICD-10-CM | POA: Insufficient documentation

## 2016-10-05 DIAGNOSIS — N898 Other specified noninflammatory disorders of vagina: Secondary | ICD-10-CM | POA: Insufficient documentation

## 2016-10-05 DIAGNOSIS — N739 Female pelvic inflammatory disease, unspecified: Secondary | ICD-10-CM | POA: Insufficient documentation

## 2016-10-05 DIAGNOSIS — F17210 Nicotine dependence, cigarettes, uncomplicated: Secondary | ICD-10-CM | POA: Insufficient documentation

## 2016-10-05 MED ORDER — CEFTRIAXONE SODIUM 250 MG IJ SOLR
INTRAMUSCULAR | Status: AC
  Filled 2016-10-05: qty 250

## 2016-10-05 MED ORDER — ACETAMINOPHEN 325 MG PO TABS
650.0000 mg | ORAL_TABLET | Freq: Once | ORAL | Status: AC
  Administered 2016-10-05: 650 mg via ORAL

## 2016-10-05 MED ORDER — ONDANSETRON 4 MG PO TBDP
ORAL_TABLET | ORAL | Status: AC
  Filled 2016-10-05: qty 1

## 2016-10-05 MED ORDER — AZITHROMYCIN 250 MG PO TABS
1000.0000 mg | ORAL_TABLET | Freq: Once | ORAL | Status: AC
  Administered 2016-10-05: 1000 mg via ORAL

## 2016-10-05 MED ORDER — ACETAMINOPHEN 325 MG PO TABS
ORAL_TABLET | ORAL | Status: AC
  Filled 2016-10-05: qty 2

## 2016-10-05 MED ORDER — CEFTRIAXONE SODIUM 250 MG IJ SOLR
250.0000 mg | Freq: Once | INTRAMUSCULAR | Status: AC
Start: 2016-10-05 — End: 2016-10-05
  Administered 2016-10-05: 250 mg via INTRAMUSCULAR

## 2016-10-05 MED ORDER — AZITHROMYCIN 250 MG PO TABS
ORAL_TABLET | ORAL | Status: AC
  Filled 2016-10-05: qty 4

## 2016-10-05 MED ORDER — DOXYCYCLINE HYCLATE 100 MG PO CAPS: 100.0000 mg | ORAL_CAPSULE | Freq: Two times a day (BID) | ORAL | 0 refills | Status: DC

## 2016-10-05 MED ORDER — ONDANSETRON 4 MG PO TBDP: 4.0000 mg | ORAL_TABLET | Freq: Three times a day (TID) | ORAL | 0 refills | Status: DC | PRN

## 2016-10-05 MED ORDER — ONDANSETRON 4 MG PO TBDP
4.0000 mg | ORAL_TABLET | Freq: Once | ORAL | Status: AC
  Administered 2016-10-05: 4 mg via ORAL

## 2016-10-05 MED ORDER — STERILE WATER FOR INJECTION IJ SOLN
INTRAMUSCULAR | Status: AC
  Filled 2016-10-05: qty 10

## 2016-10-05 MED ORDER — METRONIDAZOLE 500 MG PO TABS: 500.0000 mg | ORAL_TABLET | Freq: Two times a day (BID) | ORAL | 0 refills | Status: DC

## 2016-10-05 MED ORDER — HYDROCODONE-ACETAMINOPHEN 5-325 MG PO TABS
2.0000 | ORAL_TABLET | ORAL | 0 refills | Status: DC | PRN
Start: 2016-10-05 — End: 2018-01-21

## 2016-10-05 NOTE — ED Triage Notes (Signed)
Patient has had body aches and fever since July 1.  Patient reports vomiting 3 times today.  Patient has had diarrhea that started last night.  Reports 2-3 episodes today

## 2016-10-05 NOTE — ED Provider Notes (Signed)
CSN: [id number]     Arrival date & time 10/05/16  1711 History   None    Chief Complaint  Patient presents with  . Emesis   (Consider location/radiation/quality/duration/timing/severity/associated sxs/prior Treatment) Patient c/o body aches and pains for 3 days.  She has vaginal discharge and pelvic pain.  She has been feeling nauseated and she has malaise.   The history is provided by the patient.  Emesis  Severity:  Moderate Duration:  3 days Timing:  Constant Quality:  Stomach contents Able to tolerate:  Solids Progression:  Worsening Chronicity:  New Recent urination:  Normal Relieved by:  Nothing Worsened by:  Nothing Ineffective treatments:  None tried Associated symptoms: chills     Past Medical History:  Diagnosis Date  . Anxiety   . Bipolar 1 disorder (HCC)   . Seizures (HCC)    Past Surgical History:  Procedure Laterality Date  . ADENOIDECTOMY    . tailbone    . TONSILLECTOMY    . WISDOM TOOTH EXTRACTION     Family History  Problem Relation Age of Onset  . Depression Mother   . Depression Sister    Social History  Substance Use Topics  . Smoking status: Current Every Day Smoker    Packs/day: 0.50    Types: Cigarettes  . Smokeless tobacco: Never Used  . Alcohol use No     Comment: Socially   OB History    Gravida Para Term Preterm AB Living   2       2     SAB TAB Ectopic Multiple Live Births   2             Review of Systems  Constitutional: Positive for chills.  HENT: Negative.   Eyes: Negative.   Respiratory: Negative.   Cardiovascular: Negative.   Gastrointestinal: Positive for vomiting.  Endocrine: Negative.   Genitourinary: Negative.   Musculoskeletal: Negative.   Allergic/Immunologic: Negative.   Neurological: Negative.   Hematological: Negative.   Psychiatric/Behavioral: Negative.     Allergies  Decadron [dexamethasone]; Lactose intolerance (gi); Grapefruit extract; and Tape  Home Medications   Prior to Admission  medications   Medication Sig Start Date End Date Taking? Authorizing Provider  B Complex Vitamins (B COMPLEX 1 PO) Take 1 mg by mouth 4 (four) times daily. Liquid    [provider]  busPIRone (BUSPAR) 10 MG tablet Take 1 tablet (10 mg total) by mouth 2 (two) times daily. 10/09/15   Antony MaduraHumes, Kelly, PA-C  cyclobenzaprine (FLEXERIL) 10 MG tablet Take 1 tablet (10 mg total) by mouth 2 (two) times daily as needed for muscle spasms. 02/27/16   Isa RankinSmith, Ann B, MD  doxycycline (VIBRAMYCIN) 100 MG capsule Take 1 capsule (100 mg total) by mouth 2 (two) times daily. 10/05/16   Deatra Canterxford, Leani Myron J, FNP  HYDROcodone-acetaminophen (NORCO/VICODIN) 5-325 MG tablet Take 2 tablets by mouth every 4 (four) hours as needed. 10/05/16   Deatra Canterxford, Angelina Venard J, FNP  ibuprofen (ADVIL,MOTRIN) 600 MG tablet Take 1 tablet (600 mg total) by mouth every 6 (six) hours as needed. Patient taking differently: Take 600 mg by mouth every 6 (six) hours as needed for headache (or pain).  11/03/15   Arby BarrettePfeiffer, Marcy, MD  Lurasidone HCl (LATUDA) 60 MG TABS Take 60 mg by mouth every evening. 10/09/15   Antony MaduraHumes, Kelly, PA-C  metroNIDAZOLE (FLAGYL) 500 MG tablet Take 1 tablet (500 mg total) by mouth 2 (two) times daily. One po bid x 7 days Patient not taking: Reported on  02/27/2016 11/03/15   Arby Barrette, MD  metroNIDAZOLE (FLAGYL) 500 MG tablet Take 1 tablet (500 mg total) by mouth 2 (two) times daily. 10/05/16   Deatra Canter, FNP  Multiple Vitamins-Minerals (ONE-A-DAY FOR HER VITACRAVES) CHEW Chew 1 tablet by mouth daily.    [provider]  naproxen (NAPROSYN) 500 MG tablet Take 1 tablet (500 mg total) by mouth 2 (two) times daily. 02/27/16   Isa Rankin, MD  ondansetron (ZOFRAN ODT) 4 MG disintegrating tablet Take 1 tablet (4 mg total) by mouth every 8 (eight) hours as needed for nausea or vomiting. 10/05/16   Deatra Canter, FNP   Meds Ordered and Administered this Visit   Medications  azithromycin [facility]) tablet 1,000 mg  (1,000 mg Oral Given 10/05/16 1817)  cefTRIAXone (ROCEPHIN) injection 250 mg (250 mg Intramuscular Given 10/05/16 1818)  ondansetron (ZOFRAN-ODT) disintegrating tablet 4 mg (4 mg Oral Given 10/05/16 1817)  acetaminophen (TYLENOL) tablet 650 mg (650 mg Oral Given 10/05/16 1816)    BP 124/81 (BP Location: Left Arm)   Pulse 76   Temp 98.9 F (37.2 C) (Oral)   Resp 18   LMP 10/01/2016   SpO2 100%  No data found.   Physical Exam  Constitutional: She is oriented to person, place, and time. She appears well-developed and well-nourished.  HENT:  Head: Normocephalic and atraumatic.  Eyes: Conjunctivae and EOM are normal. Pupils are equal, round, and reactive to light.  Neck: Normal range of motion. Neck supple.  Cardiovascular: Normal rate, regular rhythm and normal heart sounds.   Pulmonary/Chest: Effort normal and breath sounds normal.  Abdominal: Soft. Bowel sounds are normal.  Genitourinary: Vaginal discharge found.  Genitourinary Comments: BUS - normal Vagina - Brown discharge Cervix - Positive CMT and tolerated poorly  Musculoskeletal: Deformity:         Neurological: She is alert and oriented to person, place, and time.  Nursing note and vitals reviewed.   Urgent Care Course     Procedures (including critical care time)  Labs Review Labs Reviewed  CERVICOVAGINAL ANCILLARY ONLY    Imaging Review No results found.   Visual Acuity Review  Right Eye Distance:   Left Eye Distance:   Bilateral Distance:    Right Eye Near:   Left Eye Near:    Bilateral Near:         MDM   1. PID (acute pelvic inflammatory disease)   2. Non-intractable cyclical vomiting with nausea   3. Vaginal discharge    Hydrocodone 5/325 one po q 6 hours prn #6 Flagyl 500mg  one po bid x 14 days #28 Doxycycline 100mg  one po bid x 14 days #28  Rocephin 250mg  IM now Azithromycin 250mg  x 4 now Tylenol 650mg  one po now      Deatra Canter, FNP 10/05/16 1900

## 2016-10-06 LAB — CERVICOVAGINAL ANCILLARY ONLY
Bacterial vaginitis: POSITIVE — AB
Candida vaginitis: POSITIVE — AB
Chlamydia: NEGATIVE
Neisseria Gonorrhea: NEGATIVE
Trichomonas: NEGATIVE

## 2016-10-07 ENCOUNTER — Telehealth (HOSPITAL_COMMUNITY): Payer: Self-pay | Admitting: Internal Medicine

## 2016-10-07 MED ORDER — FLUCONAZOLE 150 MG PO TABS: 150.0000 mg | ORAL_TABLET | Freq: Once | ORAL | 0 refills | Status: AC

## 2016-10-07 NOTE — Telephone Encounter (Signed)
Clinical staff, please let patient know that test for gardnerella (bacterial vaginosis) was positive.  Rx metronidazole was given at the urgent care visit.   Test for candida was also positive.  Rx fluconazole was sent to the pharmacy of record, [facility] on [address] [facility] [place].   Recheck for further evaluation if symptoms are not improving.  LM

## 2016-12-26 ENCOUNTER — Ambulatory Visit (HOSPITAL_COMMUNITY)
Admission: EM | Admit: 2016-12-26 | Discharge: 2016-12-26 | Disposition: A | Discharge status: Other Acute Inpatient Hospital | Payer: Self-pay

## 2016-12-26 ENCOUNTER — Ambulatory Visit (HOSPITAL_COMMUNITY)
Admission: EM | Admit: 2016-12-26 | Discharge: 2016-12-26 | Disposition: A | Discharge status: Home / Self Care | Payer: Self-pay | Attending: Internal Medicine | Admitting: Internal Medicine

## 2016-12-26 ENCOUNTER — Encounter (HOSPITAL_COMMUNITY): Payer: Self-pay | Admitting: *Deleted

## 2016-12-26 DIAGNOSIS — J0140 Acute pansinusitis, unspecified: Secondary | ICD-10-CM

## 2016-12-26 DIAGNOSIS — H6593 Unspecified nonsuppurative otitis media, bilateral: Secondary | ICD-10-CM

## 2016-12-26 MED ORDER — IPRATROPIUM BROMIDE 0.06 % NA SOLN: 2.0000 | Freq: Four times a day (QID) | NASAL | 12 refills | Status: DC

## 2016-12-26 MED ORDER — CETIRIZINE-PSEUDOEPHEDRINE ER 5-120 MG PO TB12: 1.0000 | ORAL_TABLET | Freq: Every day | ORAL | 0 refills | Status: DC

## 2016-12-26 NOTE — ED Provider Notes (Signed)
[facility]    CSN: [id number] Arrival date & time: 12/26/16  1556     History   Chief Complaint Chief Complaint  Patient presents with  . Otalgia    HPI MELANIA KIRKS is a 26 y.o. female.   26 year old female with history of anxiety, bipolar 1 disorder, seizures comes in for 2 day history of stiffness of neck/shoulders, bilateral ear pain, headache, eye pain. Patient states has swollen lymph nodes of the throat. She states headache is tight around the head, with phonophobia. Denies nausea, vomiting. Feels that ear pain with decreased hearing, and feels under the water. Nasal congestion with drainage. Denies cough, sore throat. Has had some night sweats and chills, but denies any fever when she took her temperature. Denies chest pain, shortness of breath, wheezing. Has tried otc alka sultzer without relief. History of seasonal allergies. Denies history of asthma. Current everyday smoker, has been decreasing amount trying to quit.       Past Medical History:  Diagnosis Date  . Anxiety   . Bipolar 1 disorder (HCC)   . Seizures [facility])     Patient Active Problem List   Diagnosis Date Noted  . Bipolar I disorder (HCC) 11/16/2015  . Panic attacks 11/16/2015    Past Surgical History:  Procedure Laterality Date  . ADENOIDECTOMY    . tailbone    . TONSILLECTOMY    . WISDOM TOOTH EXTRACTION      OB History    Gravida Para Term Preterm AB Living   2       2     SAB TAB Ectopic Multiple Live Births   2               Home Medications    Prior to Admission medications   Medication Sig Start Date End Date Taking? Authorizing Provider  B Complex Vitamins (B COMPLEX 1 PO) Take 1 mg by mouth 4 (four) times daily. Liquid    [provider]  busPIRone (BUSPAR) 10 MG tablet Take 1 tablet (10 mg total) by mouth 2 (two) times daily. 10/09/15   Antony Madura, PA-C  cetirizine-pseudoephedrine (ZYRTEC-D) 5-120 MG tablet Take 1 tablet by mouth daily. 12/26/16   Cathie Hoops,  Amy V, PA-C  cyclobenzaprine (FLEXERIL) 10 MG tablet Take 1 tablet (10 mg total) by mouth 2 (two) times daily as needed for muscle spasms. 02/27/16   Isa Rankin, MD  doxycycline (VIBRAMYCIN) 100 MG capsule Take 1 capsule (100 mg total) by mouth 2 (two) times daily. 10/05/16   Deatra Canter, FNP  HYDROcodone-acetaminophen (NORCO/VICODIN) 5-325 MG tablet Take 2 tablets by mouth every 4 (four) hours as needed. 10/05/16   Deatra Canter, FNP  ibuprofen (ADVIL,MOTRIN) 600 MG tablet Take 1 tablet (600 mg total) by mouth every 6 (six) hours as needed. Patient taking differently: Take 600 mg by mouth every 6 (six) hours as needed for headache (or pain).  11/03/15   Arby Barrette, MD  ipratropium (ATROVENT) 0.06 % nasal spray Place 2 sprays into both nostrils 4 (four) times daily. 12/26/16   Cathie Hoops, Amy V, PA-C  Lurasidone HCl (LATUDA) 60 MG TABS Take 60 mg by mouth every evening. 10/09/15   Antony Madura, PA-C  metroNIDAZOLE (FLAGYL) 500 MG tablet Take 1 tablet (500 mg total) by mouth 2 (two) times daily. 10/05/16   Deatra Canter, FNP  Multiple Vitamins-Minerals (ONE-A-DAY FOR HER VITACRAVES) CHEW Chew 1 tablet by mouth daily.    [provider]  naproxen (NAPROSYN) 500 MG tablet Take 1 tablet (500 mg total) by mouth 2 (two) times daily. 02/27/16   Isa Rankin, MD  ondansetron (ZOFRAN ODT) 4 MG disintegrating tablet Take 1 tablet (4 mg total) by mouth every 8 (eight) hours as needed for nausea or vomiting. 10/05/16   Deatra Canter, FNP    Family History Family History  Problem Relation Age of Onset  . Depression Mother   . Depression Sister     Social History Social History  Substance Use Topics  . Smoking status: Current Every Day Smoker    Packs/day: 0.50    Types: Cigarettes  . Smokeless tobacco: Never Used  . Alcohol use No     Comment: Socially     Allergies   Decadron [dexamethasone]; Lactose intolerance (gi); Grapefruit extract; and Tape   Review of Systems Review of  Systems  Reason unable to perform ROS: See HPI as above.     Physical Exam Triage Vital Signs ED Triage Vitals  Enc Vitals Group     BP 12/26/16 1751 128/72     Pulse Rate 12/26/16 1751 78     Resp 12/26/16 1751 18     Temp 12/26/16 1751 98.6 F (37 C)     Temp src --      SpO2 12/26/16 1751 99 %     Weight --      Height --      Head Circumference --      Peak Flow --      Pain Score 12/26/16 1753 5     Pain Loc --      Pain Edu? --      Excl. in GC? --    No data found.   Updated Vital Signs BP 128/72   Pulse 78   Temp 98.6 F (37 C)   Resp 18   LMP 12/26/2016   SpO2 99%    Physical Exam  Constitutional: She is oriented to person, place, and time. She appears well-developed and well-nourished. No distress.  HENT:  Head: Normocephalic and atraumatic.  Right Ear: External ear and ear canal normal. Tympanic membrane is not erythematous and not bulging. A middle ear effusion is present.  Left Ear: External ear and ear canal normal. Tympanic membrane is not erythematous and not bulging. A middle ear effusion is present.  Nose: Mucosal edema and rhinorrhea present. Right sinus exhibits no maxillary sinus tenderness and no frontal sinus tenderness. Left sinus exhibits no maxillary sinus tenderness and no frontal sinus tenderness.  Mouth/Throat: Uvula is midline, oropharynx is clear and moist and mucous membranes are normal.  Eyes: Pupils are equal, round, and reactive to light. Conjunctivae and EOM are normal.  Neck: Normal range of motion. Neck supple. Muscular tenderness present. No spinous process tenderness present. Normal range of motion present.  Cardiovascular: Normal rate, regular rhythm and normal heart sounds.  Exam reveals no gallop and no friction rub.   No murmur heard. Pulmonary/Chest: Effort normal and breath sounds normal. She has no decreased breath sounds. She has no wheezes. She has no rhonchi. She has no rales.  Lymphadenopathy:    She has cervical  adenopathy.  Neurological: She is alert and oriented to person, place, and time.  Skin: Skin is warm and dry.  Psychiatric: She has a normal mood and affect. Her behavior is normal. Judgment normal.     UC Treatments / Results  Labs (all labs ordered are listed, but only abnormal results  are displayed) Labs Reviewed - No data to display  EKG  EKG Interpretation None       Radiology No results found.  Procedures Procedures (including critical care time)  Medications Ordered in UC Medications - No data to display   Initial Impression / Assessment and Plan / UC Course  I have reviewed the triage vital signs and the nursing notes.  Pertinent labs & imaging results that were available during my care of the patient were reviewed by me and considered in my medical decision making (see chart for details).    History and exam consistent with sinusitis, given 2 day onset of symptoms, will provide symptomatic treatment and hold antibiotics for now. Patient with headache and neck soreness, but with full ROM of neck, is afebrile, low suspicion for meningitis. Symptomatic treatment as needed. Push fluids. Return precautions given.    Final Clinical Impressions(s) / UC Diagnoses   Final diagnoses:  Acute non-recurrent pansinusitis  Fluid level behind tympanic membrane of both ears    New Prescriptions New Prescriptions   CETIRIZINE-PSEUDOEPHEDRINE (ZYRTEC-D) 5-120 MG TABLET    Take 1 tablet by mouth daily.   IPRATROPIUM (ATROVENT) 0.06 % NASAL SPRAY    Place 2 sprays into both nostrils 4 (four) times daily.       Belinda Fisher, PA-C 12/26/16 1840

## 2016-12-26 NOTE — ED Triage Notes (Signed)
No answer in WR

## 2016-12-26 NOTE — ED Triage Notes (Signed)
Pt  Has   Symptoms  Of     Headache    Bilateral  Earache  To include   Sinus  Pressure  /  Congestion   With  Symptoms  For  Several  Days       Pt   Has  A  sorethroat  As   Well

## 2016-12-26 NOTE — Discharge Instructions (Signed)
Start atrovent, zyrtec-D for nasal congestion. You can use over the counter nasal saline rinse such as neti pot for nasal congestion. Keep hydrated, your urine should be clear to pale yellow in color. Tylenol/motrin for fever and pain. Monitor for any worsening of symptoms, chest pain, shortness of breath, wheezing, swelling of the throat, follow up for reevaluation.

## 2017-05-01 ENCOUNTER — Other Ambulatory Visit: Payer: Self-pay

## 2017-05-01 ENCOUNTER — Emergency Department (HOSPITAL_COMMUNITY): Payer: Self-pay

## 2017-05-01 ENCOUNTER — Encounter (HOSPITAL_COMMUNITY): Payer: Self-pay | Admitting: *Deleted

## 2017-05-01 ENCOUNTER — Emergency Department (HOSPITAL_COMMUNITY)
Admission: EM | Admit: 2017-05-01 | Discharge: 2017-05-01 | Disposition: A | Discharge status: Home / Self Care | Payer: Self-pay | Attending: Emergency Medicine | Admitting: Emergency Medicine

## 2017-05-01 DIAGNOSIS — F17210 Nicotine dependence, cigarettes, uncomplicated: Secondary | ICD-10-CM | POA: Insufficient documentation

## 2017-05-01 DIAGNOSIS — R109 Unspecified abdominal pain: Secondary | ICD-10-CM

## 2017-05-01 DIAGNOSIS — Z79899 Other long term (current) drug therapy: Secondary | ICD-10-CM | POA: Insufficient documentation

## 2017-05-01 DIAGNOSIS — R1031 Right lower quadrant pain: Secondary | ICD-10-CM | POA: Insufficient documentation

## 2017-05-01 LAB — COMPREHENSIVE METABOLIC PANEL
ALK PHOS: 58 U/L (ref 38–126)
ALT: 19 U/L (ref 14–54)
ANION GAP: 11 (ref 5–15)
AST: 25 U/L (ref 15–41)
Albumin: 3.7 g/dL (ref 3.5–5.0)
BILIRUBIN TOTAL: 0.4 mg/dL (ref 0.3–1.2)
BUN: 9 mg/dL (ref 6–20)
CALCIUM: 8.8 mg/dL — AB (ref 8.9–10.3)
CO2: 21 mmol/L — AB (ref 22–32)
Chloride: 104 mmol/L (ref 101–111)
Creatinine, Ser: 0.75 mg/dL (ref 0.44–1.00)
GFR calc non Af Amer: >60 mL/min (ref >60)
GLUCOSE: 92 mg/dL (ref 65–99)
Potassium: 3.9 mmol/L (ref 3.5–5.1)
Sodium: 136 mmol/L (ref 135–145)
TOTAL PROTEIN: 6.5 g/dL (ref 6.5–8.1)

## 2017-05-01 LAB — WET PREP, GENITAL
Sperm: NONE SEEN
Trich, Wet Prep: NONE SEEN
Yeast Wet Prep HPF POC: NONE SEEN

## 2017-05-01 LAB — CBC
HCT: 42.1 % (ref 36.0–46.0)
HEMOGLOBIN: 14.1 g/dL (ref 12.0–15.0)
MCH: 29.9 pg (ref 26.0–34.0)
MCHC: 33.5 g/dL (ref 30.0–36.0)
MCV: 89.2 fL (ref 78.0–100.0)
PLATELETS: 304 10*3/uL (ref 150–400)
RBC: 4.72 MIL/uL (ref 3.87–5.11)
RDW: 13.1 % (ref 11.5–15.5)
WBC: 9.6 10*3/uL (ref 4.0–10.5)

## 2017-05-01 LAB — URINALYSIS, ROUTINE W REFLEX MICROSCOPIC
BILIRUBIN URINE: NEGATIVE
Glucose, UA: NEGATIVE mg/dL
HGB URINE DIPSTICK: NEGATIVE
KETONES UR: NEGATIVE mg/dL
Leukocytes, UA: NEGATIVE
Nitrite: NEGATIVE
PROTEIN: NEGATIVE mg/dL
SPECIFIC GRAVITY, URINE: 1.017 (ref 1.005–1.030)
pH: 6 (ref 5.0–8.0)

## 2017-05-01 LAB — I-STAT BETA HCG BLOOD, ED (MC, WL, AP ONLY)

## 2017-05-01 LAB — LIPASE, BLOOD: Lipase: 29 U/L (ref 11–51)

## 2017-05-01 MED ORDER — IOPAMIDOL (ISOVUE-300) INJECTION 61%
INTRAVENOUS | Status: AC
  Administered 2017-05-01: 100 mL via INTRAVENOUS
  Filled 2017-05-01: qty 100

## 2017-05-01 MED ORDER — ONDANSETRON HCL 4 MG/2ML IJ SOLN
4.0000 mg | Freq: Once | INTRAMUSCULAR | Status: AC
  Administered 2017-05-01: 4 mg via INTRAVENOUS
  Filled 2017-05-01: qty 2

## 2017-05-01 MED ORDER — MORPHINE SULFATE (PF) 4 MG/ML IV SOLN
4.0000 mg | Freq: Once | INTRAVENOUS | Status: AC
  Administered 2017-05-01: 4 mg via INTRAVENOUS
  Filled 2017-05-01: qty 1

## 2017-05-01 MED ORDER — SODIUM CHLORIDE 0.9 % IV BOLUS (SEPSIS)
1000.0000 mL | Freq: Once | INTRAVENOUS | Status: AC
  Administered 2017-05-01: 1000 mL via INTRAVENOUS

## 2017-05-01 MED ORDER — TRAMADOL HCL 50 MG PO TABS: 50.0000 mg | ORAL_TABLET | Freq: Four times a day (QID) | ORAL | 0 refills | Status: DC | PRN

## 2017-05-01 MED ORDER — LIDOCAINE 5 % EX PTCH: 1.0000 | MEDICATED_PATCH | CUTANEOUS | 0 refills | Status: DC

## 2017-05-01 MED ORDER — NAPROXEN 375 MG PO TABS: 375.0000 mg | ORAL_TABLET | Freq: Two times a day (BID) | ORAL | 0 refills | Status: DC

## 2017-05-01 MED ORDER — KETOROLAC TROMETHAMINE 30 MG/ML IJ SOLN
15.0000 mg | Freq: Once | INTRAMUSCULAR | Status: AC
  Administered 2017-05-01: 15 mg via INTRAVENOUS
  Filled 2017-05-01: qty 1

## 2017-05-01 NOTE — ED Notes (Signed)
Patient transported to Ultrasound 

## 2017-05-01 NOTE — ED Notes (Signed)
Pt unable to use the bedpan.

## 2017-05-01 NOTE — ED Notes (Signed)
Patient transported to CT 

## 2017-05-01 NOTE — ED Notes (Signed)
Pt placed on a bedpan.  

## 2017-05-01 NOTE — ED Notes (Signed)
Pt walked gingerly to the bathroom with assistance.

## 2017-05-01 NOTE — ED Triage Notes (Signed)
Pt is here with right lower abdominal pain that radiates around to her back and into her right leg.  Pt states the pain started at midnight. No vaginal bleeding or discharge. Pain started out as dull and then increased. This am she got up and she did not feel well. Pain has increased since coming to the hospital. Abdominal distention

## 2017-05-01 NOTE — ED Notes (Signed)
Pelvic exam done by Joselyn Glassmanyler - PA &  Kenney Housemananya - EMT assisted.

## 2017-05-01 NOTE — Discharge Instructions (Signed)
Your workup and imaging has been very reassuring in the ED.  Unknown cause of your symptoms.  Please take the Naproxen as prescribed for pain. Do not take any additional NSAIDs including Motrin, Aleve, Ibuprofen, Advil.  Have given you short course of pain medication to take out of this medication will make you drowsy so do not drive with it.  Use lidocaine patches to help with pain at specific points.  Use warm compresses.  Please make sure to follow-up the primary care doctor return the ED if your symptoms worsen or not improving the next 2-3 days.

## 2017-05-01 NOTE — ED Notes (Signed)
Pelvic cart at pt's bedside. 

## 2017-05-01 NOTE — ED Provider Notes (Signed)
[facility] EMERGENCY DEPARTMENT Provider Note   CSN: [id number] Arrival date & time: 05/01/17  1120     History   Chief Complaint Chief Complaint  Patient presents with  . Abdominal Pain    HPI Jill Daniel is a 27 y.o. female.  HPI 27 year old Caucasian female past medical history significant for anxiety, bipolar disorder, seizures presents to the emergency department today with complaints of right lower quadrant abdominal pain/pelvic pain.  Acute onset yesterday afternoon.  Pain has gradually worsened.  Radiates to her right flank.  Nothing makes better or worse.  Patient has not tried any over-the-counter medications for her symptoms.  She reports the pain is sharp in nature and describes it as a 10 out of 10.  Patient denies any associated urinary symptoms, vaginal bleeding, vaginal discharge.  She is sexually active but has no concern for STD.  Patient does have a history of ovarian cyst that is consistent with her PCO S.  Patient reports some nausea but denies any emesis.  Denies any associated vomiting.  No history of same.  The patient states the pain does somewhat travel into her right hip.  It is painful to move the right hip and she feels the pain in her right lower abdomen.  Describes it as a pulling sensation.  Pt denies any fever, chill, ha, vision changes, lightheadedness, dizziness, congestion, neck pain, cp, sob, cough,  urinary symptoms, change in bowel habits, melena, hematochezia, lower extremity paresthesias.  Past Medical History:  Diagnosis Date  . Anxiety   . Bipolar 1 disorder ([place])   . Seizures [facility])     Patient Active Problem List   Diagnosis Date Noted  . Bipolar I disorder ([place]) 11/16/2015  . Panic attacks 11/16/2015    Past Surgical History:  Procedure Laterality Date  . ADENOIDECTOMY    . tailbone    . TONSILLECTOMY    . WISDOM TOOTH EXTRACTION      OB History    Gravida Para Term Preterm AB Living   2       2     SAB TAB Ectopic Multiple Live Births   2               Home Medications    Prior to Admission medications   Medication Sig Start Date End Date Taking? Authorizing Provider  B Complex Vitamins (B COMPLEX 1 PO) Take 1 mg by mouth 4 (four) times daily. Liquid    [provider]  busPIRone (BUSPAR) 10 MG tablet Take 1 tablet (10 mg total) by mouth 2 (two) times daily. 10/09/15   Antonietta Breach, PA-C  cetirizine-pseudoephedrine (ZYRTEC-D) 5-120 MG tablet Take 1 tablet by mouth daily. 12/26/16   Tasia Catchings, Amy V, PA-C  cyclobenzaprine (FLEXERIL) 10 MG tablet Take 1 tablet (10 mg total) by mouth 2 (two) times daily as needed for muscle spasms. 02/27/16   Gibson Ramp, MD  doxycycline (VIBRAMYCIN) 100 MG capsule Take 1 capsule (100 mg total) by mouth 2 (two) times daily. 10/05/16   Lysbeth Penner, FNP  HYDROcodone-acetaminophen (NORCO/VICODIN) 5-325 MG tablet Take 2 tablets by mouth every 4 (four) hours as needed. 10/05/16   Lysbeth Penner, FNP  ibuprofen (ADVIL,MOTRIN) 600 MG tablet Take 1 tablet (600 mg total) by mouth every 6 (six) hours as needed. Patient taking differently: Take 600 mg by mouth every 6 (six) hours as needed for headache (or pain).  11/03/15   Charlesetta Shanks, MD  ipratropium (ATROVENT) 0.06 %  nasal spray Place 2 sprays into both nostrils 4 (four) times daily. 12/26/16   Tasia Catchings, Amy V, PA-C  Lurasidone HCl (LATUDA) 60 MG TABS Take 60 mg by mouth every evening. 10/09/15   Antonietta Breach, PA-C  metroNIDAZOLE (FLAGYL) 500 MG tablet Take 1 tablet (500 mg total) by mouth 2 (two) times daily. 10/05/16   Lysbeth Penner, FNP  Multiple Vitamins-Minerals (ONE-A-DAY FOR HER VITACRAVES) CHEW Chew 1 tablet by mouth daily.    [provider]  naproxen (NAPROSYN) 500 MG tablet Take 1 tablet (500 mg total) by mouth 2 (two) times daily. 02/27/16   Gibson Ramp, MD  ondansetron (ZOFRAN ODT) 4 MG disintegrating tablet Take 1 tablet (4 mg total) by mouth every 8 (eight) hours as needed for nausea or  vomiting. 10/05/16   Lysbeth Penner, FNP    Family History Family History  Problem Relation Age of Onset  . Depression Mother   . Depression Sister     Social History Social History   Tobacco Use  . Smoking status: Current Every Day Smoker    Packs/day: 0.50    Types: Cigarettes  . Smokeless tobacco: Never Used  Substance Use Topics  . Alcohol use: No    Comment: Socially  . Drug use: No     Allergies   Decadron [dexamethasone]; Lactose intolerance (gi); Grapefruit extract; and Tape   Review of Systems Review of Systems  Constitutional: Negative for chills and fever.  HENT: Negative for congestion.   Eyes: Negative for visual disturbance.  Respiratory: Negative for cough and shortness of breath.   Cardiovascular: Negative for chest pain.  Gastrointestinal: Positive for nausea. Negative for abdominal pain, diarrhea and vomiting.  Genitourinary: Negative for dysuria, flank pain, frequency, hematuria, urgency, vaginal bleeding and vaginal discharge.  Musculoskeletal: Positive for back pain. Negative for arthralgias and myalgias.  Skin: Negative for rash.  Neurological: Negative for dizziness, syncope, weakness, light-headedness, numbness and headaches.  Psychiatric/Behavioral: Negative for sleep disturbance. The patient is not nervous/anxious.      Physical Exam Updated Vital Signs BP 110/79   Pulse 62   Temp 98.3 F (36.8 C) (Oral)   Resp 18   LMP 04/02/2017 (Approximate)   SpO2 100%   Physical Exam  Constitutional: She is oriented to person, place, and time. She appears well-developed and well-nourished.  Non-toxic appearance. No distress.  HENT:  Head: Normocephalic and atraumatic.  Nose: Nose normal.  Mouth/Throat: Oropharynx is clear and moist.  Eyes: Conjunctivae are normal. Pupils are equal, round, and reactive to light. Right eye exhibits no discharge. Left eye exhibits no discharge.  Neck: Normal range of motion. Neck supple.  Cardiovascular:  Normal rate, regular rhythm, normal heart sounds and intact distal pulses. Exam reveals no gallop and no friction rub.  No murmur heard. Pulmonary/Chest: Effort normal and breath sounds normal. No stridor. No respiratory distress. She has no wheezes. She has no rales. She exhibits no tenderness.  Abdominal: Soft. Bowel sounds are normal. There is tenderness in the right lower quadrant. There is no rigidity, no rebound, no guarding, no CVA tenderness, no tenderness at McBurney's point and negative Murphy's sign.  Genitourinary:  Genitourinary Comments: Chaperone present for exam. No external lesions, swelling, erythema, or rash of the labia. No erythema, discharge, bleeding, or lesions noted in the vaginal vault. No CMT tenderness, bleeding or friability. No adnexal tenderness, mass or fullness bilaterally. No inguinal adenopathy or hernia.    Musculoskeletal: Normal range of motion. She exhibits no tenderness.  No midline T spine or L spine tenderness. No deformities or step offs noted. Full ROM. Pelvis is stable.  She has mild tenderness palpation of the right side of lumbar region that radiates to the right buttocks.   DP pulses are 2+ bilaterally.  Sensation intact.  Cap refill is normal.  Lymphadenopathy:    She has no cervical adenopathy.  Neurological: She is alert and oriented to person, place, and time.  Strength 5 out of 5 in lower extremities.  Sensation intact in all dermatomes.  Patellar reflexes are normal.  No clonus noted.  Normal strength of plantar and dorsiflexion.  Patient is able to ambulate with normal gait.  Normal muscle tone.  Skin compartments are soft.  Skin: Skin is warm and dry. Capillary refill takes less than 2 seconds.  Psychiatric: Her behavior is normal. Judgment and thought content normal.  Nursing note and vitals reviewed.    ED Treatments / Results  Labs (all labs ordered are listed, but only abnormal results are displayed) Labs Reviewed  WET PREP,  GENITAL - Abnormal; Notable for the following components:      Result Value   Clue Cells Wet Prep HPF POC PRESENT (*)    WBC, Wet Prep HPF POC MANY (*)    All other components within normal limits  COMPREHENSIVE METABOLIC PANEL - Abnormal; Notable for the following components:   CO2 21 (*)    Calcium 8.8 (*)    All other components within normal limits  URINALYSIS, ROUTINE W REFLEX MICROSCOPIC - Abnormal; Notable for the following components:   APPearance HAZY (*)    All other components within normal limits  LIPASE, BLOOD  CBC  I-STAT BETA HCG BLOOD, ED ([facility], [facility], [facility] ONLY)  GC/CHLAMYDIA PROBE AMP ([place]) NOT AT [facility]    EKG  EKG Interpretation None       Radiology US Transvaginal Non-ob  Result Date: 05/01/2017 CLINICAL DATA:  Right pelvic pain. History of cysts and polycystic ovarian syndrome. EXAM: TRANSABDOMINAL AND TRANSVAGINAL ULTRASOUND OF PELVIS DOPPLER ULTRASOUND OF OVARIES TECHNIQUE: Both transabdominal and transvaginal ultrasound examinations of the pelvis were performed. Transabdominal technique was performed for global imaging of the pelvis including uterus, ovaries, adnexal regions, and pelvic cul-de-sac. It was necessary to proceed with endovaginal exam following the transabdominal exam to visualize the endometrium and ovaries to better advantage. Color and duplex Doppler ultrasound was utilized to evaluate blood flow to the ovaries. COMPARISON:  Pelvic ultrasound 11/03/2015.  Pelvic CT 10/18/2015. FINDINGS: Uterus Measurements: Approximately 8.0 x 3.6 x 5.4 cm. No fibroids or other mass visualized. Divergent endometrial canal in the fundal region again noted, likely septate uterus based normal external configuration the uterine fundus on prior pelvic CT. Endometrium Thickness: 9 mm.  No focal abnormality visualized. Right ovary Measurements: 2.8 x 2.0 x 1.7 cm. Normal appearance/no adnexal mass. Normal blood flow with color Doppler. Left ovary Measurements: 1.8 x 3.1 x  1.6 cm. Normal appearance/no adnexal mass. Normal blood flow with color Doppler. Pulsed Doppler evaluation of both ovaries demonstrates normal low-resistance arterial and venous waveforms. Other findings Small to moderate free pelvic fluid. IMPRESSION: 1. Small to moderate free pelvic fluid. 2. No evidence of ovarian torsion or adnexal mass. 3. 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 Electronically Signed   By: Richardean Sale M.D.   On: 05/01/2017 14:56   US Pelvis Complete  Result Date: 05/01/2017 CLINICAL DATA:  Right pelvic pain. History of cysts and polycystic ovarian syndrome. EXAM: TRANSABDOMINAL AND TRANSVAGINAL ULTRASOUND OF PELVIS DOPPLER  ULTRASOUND OF OVARIES TECHNIQUE: Both transabdominal and transvaginal ultrasound examinations of the pelvis were performed. Transabdominal technique was performed for global imaging of the pelvis including uterus, ovaries, adnexal regions, and pelvic cul-de-sac. It was necessary to proceed with endovaginal exam following the transabdominal exam to visualize the endometrium and ovaries to better advantage. Color and duplex Doppler ultrasound was utilized to evaluate blood flow to the ovaries. COMPARISON:  Pelvic ultrasound 11/03/2015.  Pelvic CT 10/18/2015. FINDINGS: Uterus Measurements: Approximately 8.0 x 3.6 x 5.4 cm. No fibroids or other mass visualized. Divergent endometrial canal in the fundal region again noted, likely septate uterus based normal external configuration the uterine fundus on prior pelvic CT. Endometrium Thickness: 9 mm.  No focal abnormality visualized. Right ovary Measurements: 2.8 x 2.0 x 1.7 cm. Normal appearance/no adnexal mass. Normal blood flow with color Doppler. Left ovary Measurements: 1.8 x 3.1 x 1.6 cm. Normal appearance/no adnexal mass. Normal blood flow with color Doppler. Pulsed Doppler evaluation of both ovaries demonstrates normal low-resistance arterial and venous waveforms. Other findings Small to moderate free pelvic fluid. IMPRESSION: 1.  Small to moderate free pelvic fluid. 2. No evidence of ovarian torsion or adnexal mass. 3. 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 Electronically Signed   By: Richardean Sale M.D.   On: 05/01/2017 14:56   Ct Abdomen Pelvis W Contrast  Result Date: 05/01/2017 CLINICAL DATA:  Increasing right lower abdominal pain extending into the back and right leg. EXAM: CT ABDOMEN AND PELVIS WITH CONTRAST TECHNIQUE: Multidetector CT imaging of the abdomen and pelvis was performed using the standard protocol following bolus administration of intravenous contrast. CONTRAST:  149m ISOVUE-300 IOPAMIDOL (ISOVUE-300) INJECTION 61% COMPARISON:  CT scan dated 10/18/2015 FINDINGS: Lower chest: Minimal atelectasis at the lung bases posteriorly. Hepatobiliary: No focal liver abnormality is seen. No gallstones, gallbladder wall thickening, or biliary dilatation. Pancreas: Unremarkable. No pancreatic ductal dilatation or surrounding inflammatory changes. Spleen: Normal in size without focal abnormality. Adrenals/Urinary Tract: Adrenal glands are unremarkable. Kidneys are normal, without renal calculi, focal lesion, or hydronephrosis. Bladder is unremarkable. Stomach/Bowel: Stomach is within normal limits. Appendix appears normal. No evidence of bowel wall thickening, distention, or inflammatory changes. Vascular/Lymphatic: No significant vascular findings are present. No enlarged abdominal or pelvic lymph nodes. Reproductive: Septate uterus best seen on image 625of series 3. Normal appearing ovaries. Other: Small amount of free fluid in the pelvis, normal for a female of this age. No abdominal wall hernias. Musculoskeletal: No acute or significant osseous findings. IMPRESSION: 1. No acute abnormalities of the abdomen or pelvis. 2. Septate uterus. Electronically Signed   By: JLorriane ShireM.D.   On: 05/01/2017 17:21   [place]Art/ven Flow Abd Pelv Doppler  Result Date: 05/01/2017 CLINICAL DATA:  Right pelvic pain. History of cysts and polycystic ovarian  syndrome. EXAM: TRANSABDOMINAL AND TRANSVAGINAL ULTRASOUND OF PELVIS DOPPLER ULTRASOUND OF OVARIES TECHNIQUE: Both transabdominal and transvaginal ultrasound examinations of the pelvis were performed. Transabdominal technique was performed for global imaging of the pelvis including uterus, ovaries, adnexal regions, and pelvic cul-de-sac. It was necessary to proceed with endovaginal exam following the transabdominal exam to visualize the endometrium and ovaries to better advantage. Color and duplex Doppler ultrasound was utilized to evaluate blood flow to the ovaries. COMPARISON:  Pelvic ultrasound 11/03/2015.  Pelvic CT 10/18/2015. FINDINGS: Uterus Measurements: Approximately 8.0 x 3.6 x 5.4 cm. No fibroids or other mass visualized. Divergent endometrial canal in the fundal region again noted, likely septate uterus based normal external configuration the uterine fundus on prior pelvic CT.  Endometrium Thickness: 9 mm.  No focal abnormality visualized. Right ovary Measurements: 2.8 x 2.0 x 1.7 cm. Normal appearance/no adnexal mass. Normal blood flow with color Doppler. Left ovary Measurements: 1.8 x 3.1 x 1.6 cm. Normal appearance/no adnexal mass. Normal blood flow with color Doppler. Pulsed Doppler evaluation of both ovaries demonstrates normal low-resistance arterial and venous waveforms. Other findings Small to moderate free pelvic fluid. IMPRESSION: 1. Small to moderate free pelvic fluid. 2. No evidence of ovarian torsion or adnexal mass. 3. 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 Electronically Signed   By: Richardean Sale M.D.   On: 05/01/2017 14:56    Procedures Procedures (including critical care time)  Medications Ordered in ED Medications  morphine 4 MG/ML injection 4 mg (4 mg Intravenous Given 05/01/17 1246)  ondansetron (ZOFRAN) injection 4 mg (4 mg Intravenous Given 05/01/17 1245)  sodium chloride 0.9 % bolus 1,000 mL (0 mLs Intravenous Stopped 05/01/17 1421)  ketorolac (TORADOL) 30 MG/ML injection 15 mg (15 mg  Intravenous Given 05/01/17 1547)  morphine 4 MG/ML injection 4 mg (4 mg Intravenous Given 05/01/17 1547)  iopamidol (ISOVUE-300) 61 % injection (100 mLs Intravenous Contrast Given 05/01/17 1646)  ondansetron (ZOFRAN) injection 4 mg (4 mg Intravenous Given 05/01/17 1640)     Initial Impression / Assessment and Plan / ED Course  I have reviewed the triage vital signs and the nursing notes.  Pertinent labs & imaging results that were available during my care of the patient were reviewed by me and considered in my medical decision making (see chart for details).     Patient presents to the ED for evaluation of right lower quadrant abdominal pain that radiates to the right flank.  No history of same.  Patient reports associated nausea but denies any chills, vomiting, urinary symptoms, vaginal discharge, change in bowel habits.  She is overall well-appearing and nontoxic.  Her vital signs are reassuring.  Patient is afebrile, no tachycardia, no hypotension noted.  On exam patient is well-appearing.  She does have some pain to the right lower quadrant on palpation but no signs of peritonitis.  Her pelvic exam reveals no cervical motion tenderness or adnexal tenderness.  No vaginal bleeding or vaginal discharge noted.  Patient has no midline L-spine tenderness.  Does have some mild right-sided paraspinal tenderness that radiates to the right buttocks.  Neurovascularly intact in all extremities.  No focal neuro deficit with normal strength and reflexes in lower extremities.  Heart regular rate and rhythm.  Lungs clear to auscultation bilaterally.  The patient's lab work has been very reassuring.  No leukocytosis is noted.  Kidney function is normal.  Electrolytes are reassuring and at patient's baseline.  Liver enzymes are normal.  UA shows no signs of infection.  Her wet prep does show many bacteria and a few clue cells.  Negative beta hCG.  Urine Chlamydia cultures are pending at this time however have low  suspicion.  Initial concern for possible ovarian torsion given her history of PCO S and ovarian cyst.  Ultrasound was obtained that showed no signs of ovarian torsion or cyst in the right ovary.  Normal ultrasound.  Given patient's continued pain discussed symptomatic treatment versus further imaging and patient has requested that we obtain CT scan.  CT scan of abdomen showed no acute abnormalities.  Except for a septated uterus.  Pains been controlled in the ED.  Tolerating p.o. fluids appropriately.  On repeat exam no signs of peritonitis or focal tenderness.  Unknown etiology of patient's symptoms.  Patient reports some lower back pain possibly related to sciatic nerve pain.  However this would not explain her right lower quadrant abdominal pain.  She is neurovascularly intact.  Denies any IV drug use.  Symptoms may be related to starting her menstrual period.  No signs of ovarian torsion.  Doubt PID given no cervical motion tenderness.  Patient has clue cells on her wet prep however she denies any vaginal discharge.  This does not seem consistent with bacterial vaginosis and we will not treat at this time unless patient becomes symptomatic with vaginal discharge.Marland Kitchen  Appendix was identified on CT scan that shows no signs of appendicitis.  Patient is able to ambulate with normal gait.  Have discussed very strict return precautions with patient.  Encouraged him to met treatment at home and will give short course of pain medication.  Asked patient to have reevaluation in 2-3 days if symptoms not improving or sooner if symptoms worsen.  Pt is hemodynamically stable, in NAD, & able to ambulate in the ED. Evaluation does not show pathology that would require ongoing emergent intervention or inpatient treatment. I explained the diagnosis to the patient. Pain has been managed & has no complaints prior to dc. Pt is comfortable with above plan and is stable for discharge at this time. All questions were answered  prior to disposition. Strict return precautions for f/u to the ED were discussed. Encouraged follow up with PCP.   Final Clinical Impressions(s) / ED Diagnoses   Final diagnoses:  RLQ abdominal pain  Right flank pain    ED Discharge Orders        Ordered    naproxen (NAPROSYN) 375 MG tablet  2 times daily     05/01/17 1733    traMADol (ULTRAM) 50 MG tablet  Every 6 hours PRN     05/01/17 1733    lidocaine (LIDODERM) 5 %  Every 24 hours     05/01/17 1733       Doristine Devoid, PA-C 05/01/17 1744    Quintella Reichert, MD 05/02/17 [phone]

## 2017-05-02 LAB — GC/CHLAMYDIA PROBE AMP (~~LOC~~) NOT AT ARMC
Chlamydia: NEGATIVE
Neisseria Gonorrhea: NEGATIVE

## 2017-09-29 ENCOUNTER — Other Ambulatory Visit: Payer: Self-pay

## 2017-09-29 ENCOUNTER — Emergency Department: Payer: Medicaid Other

## 2017-09-29 ENCOUNTER — Telehealth: Payer: Self-pay | Admitting: Emergency Medicine

## 2017-09-29 ENCOUNTER — Emergency Department
Admission: EM | Admit: 2017-09-29 | Discharge: 2017-09-29 | Disposition: A | Discharge status: Home / Self Care | Payer: Medicaid Other | Source: Home / Self Care | Attending: Student in an Organized Health Care Education/Training Program | Admitting: Student in an Organized Health Care Education/Training Program

## 2017-09-29 ENCOUNTER — Emergency Department
Admission: EM | Admit: 2017-09-29 | Discharge: 2017-09-29 | Disposition: A | Discharge status: Home / Self Care | Payer: Medicaid Other | Attending: Emergency Medicine | Admitting: Emergency Medicine

## 2017-09-29 ENCOUNTER — Encounter: Payer: Self-pay | Admitting: *Deleted

## 2017-09-29 DIAGNOSIS — O209 Hemorrhage in early pregnancy, unspecified: Secondary | ICD-10-CM | POA: Insufficient documentation

## 2017-09-29 DIAGNOSIS — B9689 Other specified bacterial agents as the cause of diseases classified elsewhere: Secondary | ICD-10-CM

## 2017-09-29 DIAGNOSIS — Z87891 Personal history of nicotine dependence: Secondary | ICD-10-CM | POA: Insufficient documentation

## 2017-09-29 DIAGNOSIS — Z79899 Other long term (current) drug therapy: Secondary | ICD-10-CM | POA: Diagnosis not present

## 2017-09-29 DIAGNOSIS — Z3A12 12 weeks gestation of pregnancy: Secondary | ICD-10-CM | POA: Insufficient documentation

## 2017-09-29 DIAGNOSIS — O23591 Infection of other part of genital tract in pregnancy, first trimester: Secondary | ICD-10-CM | POA: Diagnosis not present

## 2017-09-29 DIAGNOSIS — N760 Acute vaginitis: Secondary | ICD-10-CM

## 2017-09-29 DIAGNOSIS — A549 Gonococcal infection, unspecified: Secondary | ICD-10-CM

## 2017-09-29 LAB — COMPREHENSIVE METABOLIC PANEL
ALT: 16 U/L (ref 0–44)
AST: 19 U/L (ref 15–41)
Albumin: 3.7 g/dL (ref 3.5–5.0)
Alkaline Phosphatase: 43 U/L (ref 38–126)
Anion gap: 8 (ref 5–15)
CHLORIDE: 102 mmol/L (ref 98–111)
CO2: 24 mmol/L (ref 22–32)
CREATININE: 0.47 mg/dL (ref 0.44–1.00)
Calcium: 8.7 mg/dL — ABNORMAL LOW (ref 8.9–10.3)
GFR calc Af Amer: >60 mL/min (ref >60)
GFR calc non Af Amer: >60 mL/min (ref >60)
Glucose, Bld: 91 mg/dL (ref 70–99)
Potassium: 3.7 mmol/L (ref 3.5–5.1)
Sodium: 134 mmol/L — ABNORMAL LOW (ref 135–145)
Total Bilirubin: 0.4 mg/dL (ref 0.3–1.2)
Total Protein: 6.8 g/dL (ref 6.5–8.1)

## 2017-09-29 LAB — CHLAMYDIA/NGC RT PCR (ARMC ONLY)
Chlamydia Tr: NOT DETECTED
N gonorrhoeae: DETECTED — AB

## 2017-09-29 LAB — URINALYSIS, COMPLETE (UACMP) WITH MICROSCOPIC
BACTERIA UA: NONE SEEN
BILIRUBIN URINE: NEGATIVE
Glucose, UA: NEGATIVE mg/dL
Hgb urine dipstick: NEGATIVE
KETONES UR: NEGATIVE mg/dL
Leukocytes, UA: NEGATIVE
Nitrite: NEGATIVE
Protein, ur: NEGATIVE mg/dL
Specific Gravity, Urine: 1.004 — ABNORMAL LOW (ref 1.005–1.030)
pH: 8 (ref 5.0–8.0)

## 2017-09-29 LAB — CBC
HEMATOCRIT: 37.1 % (ref 35.0–47.0)
Hemoglobin: 12.9 g/dL (ref 12.0–16.0)
MCH: 30.4 pg (ref 26.0–34.0)
MCHC: 34.7 g/dL (ref 32.0–36.0)
MCV: 87.6 fL (ref 80.0–100.0)
Platelets: 285 10*3/uL (ref 150–440)
RBC: 4.24 MIL/uL (ref 3.80–5.20)
RDW: 13.4 % (ref 11.5–14.5)
WBC: 9.8 10*3/uL (ref 3.6–11.0)

## 2017-09-29 LAB — WET PREP, GENITAL
SPERM: NONE SEEN
Trich, Wet Prep: NONE SEEN
Yeast Wet Prep HPF POC: NONE SEEN

## 2017-09-29 LAB — HCG, QUANTITATIVE, PREGNANCY: hCG, Beta Chain, Quant, S: 75365 m[IU]/mL — ABNORMAL HIGH (ref <5)

## 2017-09-29 LAB — ABO/RH: ABO/RH(D): O POS

## 2017-09-29 MED ORDER — LIDOCAINE HCL (PF) 1 % IJ SOLN
0.9000 mL | Freq: Once | INTRAMUSCULAR | Status: AC
  Administered 2017-09-29: 0.9 mL
  Filled 2017-09-29: qty 5

## 2017-09-29 MED ORDER — CEFTRIAXONE SODIUM 250 MG IJ SOLR
250.0000 mg | Freq: Once | INTRAMUSCULAR | Status: AC
  Administered 2017-09-29: 250 mg via INTRAMUSCULAR
  Filled 2017-09-29: qty 250

## 2017-09-29 MED ORDER — METRONIDAZOLE 500 MG PO TABS: 500.0000 mg | ORAL_TABLET | Freq: Two times a day (BID) | ORAL | 0 refills | Status: DC

## 2017-09-29 NOTE — ED Provider Notes (Signed)
[facility] Emergency Department Provider Note   ____________________________________________   First MD Initiated Contact with Patient 09/29/17 1240     (approximate)  I have reviewed the triage vital signs and the nursing notes.   HISTORY  Chief Complaint Vaginal Bleeding    HPI Jill Daniel is a 27 y.o. female reports a previous history of bipolar disorder  Patient reports she has been previously pregnant twice in the past, never carried past the first trimester as she has had 2 spontaneous miscarriages in the distant past.  She reports that she is about 11-1/2 to [redacted] weeks pregnant today, the last 2 weeks she has been experiencing a slight amount of vaginal spotting that occurs off and on noticed mostly after urination.  It slightly pink in nature and never more than a pad worth of bleeding and a day.  Not associated any abdominal cramps or pain.  No fevers or chills.  No heavy or yellow or foul-smelling discharge.  Sexually active with one partner.  States her blood type is O+ in the past.  No chest pain or trouble breathing.  Patient [place] and was told she was pregnant and had an ultrasound a few weeks at about 7 weeks of pregnancy though they were unsure if she could have had a twin pregnancy at that time.  She has not had any concerns during this pregnancy except for spotting which she is noticed for the last 2 to 3 weeks.  Past Medical History:  Diagnosis Date  . Anxiety   . Bipolar 1 disorder (HCC)   . Seizures [facility] [place](HCC)     Patient Active Problem List   Diagnosis Date Noted  . Bipolar I disorder (HCC) 11/16/2015  . Panic attacks 11/16/2015    Past Surgical History:  Procedure Laterality Date  . ADENOIDECTOMY    . tailbone    . TONSILLECTOMY    . WISDOM TOOTH EXTRACTION      Prior to Admission medications   Medication Sig Start Date End Date Taking? Authorizing Provider  busPIRone (BUSPAR)  10 MG tablet Take 1 tablet (10 mg total) by mouth 2 (two) times daily. Patient not taking: Reported on 09/29/2017 10/09/15   Antony MaduraHumes, Kelly, PA-C  cetirizine-pseudoephedrine (ZYRTEC-D) 5-120 MG tablet Take 1 tablet by mouth daily. Patient not taking: Reported on 05/01/2017 12/26/16   Belinda FisherYu, Amy V, PA-C  cyclobenzaprine (FLEXERIL) 10 MG tablet Take 1 tablet (10 mg total) by mouth 2 (two) times daily as needed for muscle spasms. Patient not taking: Reported on 05/01/2017 02/27/16   Isa RankinSmith, Ann B, MD  doxycycline (VIBRAMYCIN) 100 MG capsule Take 1 capsule (100 mg total) by mouth 2 (two) times daily. Patient not taking: Reported on 05/01/2017 10/05/16   Deatra Canterxford, William J, FNP  HYDROcodone-acetaminophen (NORCO/VICODIN) 5-325 MG tablet Take 2 tablets by mouth every 4 (four) hours as needed. Patient not taking: Reported on 05/01/2017 10/05/16   Deatra Canterxford, William J, FNP  ibuprofen (ADVIL,MOTRIN) 600 MG tablet Take 1 tablet (600 mg total) by mouth every 6 (six) hours as needed. Patient not taking: Reported on 05/01/2017 11/03/15   Arby BarrettePfeiffer, Marcy, MD  ipratropium (ATROVENT) 0.06 % nasal spray Place 2 sprays into both nostrils 4 (four) times daily. Patient not taking: Reported on 05/01/2017 12/26/16   Belinda FisherYu, Amy V, PA-C  lidocaine (LIDODERM) 5 % Place 1 patch onto the skin daily. Remove & Discard patch within 12 hours or as directed by MD Patient not  taking: Reported on 09/29/2017 05/01/17   Demetrios Loll T, PA-C  Lurasidone HCl (LATUDA) 60 MG TABS Take 60 mg by mouth every evening. Patient not taking: Reported on 09/29/2017 10/09/15   Antony Madura, PA-C  metroNIDAZOLE (FLAGYL) 500 MG tablet Take 1 tablet (500 mg total) by mouth 2 (two) times daily. 09/29/17   Sharyn Creamer, MD  naproxen (NAPROSYN) 375 MG tablet Take 1 tablet (375 mg total) by mouth 2 (two) times daily. Patient not taking: Reported on 09/29/2017 05/01/17   Demetrios Loll T, PA-C  ondansetron (ZOFRAN ODT) 4 MG disintegrating tablet Take 1 tablet (4 mg total) by mouth  every 8 (eight) hours as needed for nausea or vomiting. Patient not taking: Reported on 05/01/2017 10/05/16   Deatra Canter, FNP  traMADol (ULTRAM) 50 MG tablet Take 1 tablet (50 mg total) by mouth every 6 (six) hours as needed. Patient not taking: Reported on 09/29/2017 05/01/17   Demetrios Loll T, PA-C    Allergies Decadron [dexamethasone]; Lactose intolerance (gi); Grapefruit extract; and Tape  Family History  Problem Relation Age of Onset  . Depression Mother   . Depression Sister     Social History Social History   Tobacco Use  . Smoking status: Former Smoker    Packs/day: 0.00  . Smokeless tobacco: Never Used  Substance Use Topics  . Alcohol use: No    Comment: Socially  . Drug use: No    Review of Systems Constitutional: No fever/chills Eyes: No visual changes. ENT: No sore throat. Cardiovascular: Denies chest pain. Respiratory: Denies shortness of breath. Gastrointestinal: No abdominal pain.  No nausea, no vomiting.  Genitourinary: Negative for dysuria.  See HPI. Musculoskeletal: Negative for back pain. Skin: Negative for rash. Neurological: Negative for headaches or weakness.    ____________________________________________   PHYSICAL EXAM:  VITAL SIGNS: ED Triage Vitals  Enc Vitals Group     BP 09/29/17 0952 115/73     Pulse Rate 09/29/17 0952 81     Resp 09/29/17 0952 17     Temp 09/29/17 0952 99.5 F (37.5 C)     Temp Source 09/29/17 0952 Oral     SpO2 09/29/17 0952 98 %     Weight 09/29/17 0953 157 lb (71.2 kg)     Height 09/29/17 0953 5\' 2"  (1.575 m)     Head Circumference --      Peak Flow --      Pain Score 09/29/17 0952 0     Pain Loc --      Pain Edu? --      Excl. in GC? --     Constitutional: Alert and oriented. Well appearing and in no acute distress. Eyes: Conjunctivae are normal. Head: Atraumatic. Nose: No congestion/rhinnorhea. Mouth/Throat: Mucous membranes are moist. Neck: No stridor.   Cardiovascular: Normal rate,  regular rhythm. Grossly normal heart sounds.  Good peripheral circulation. Respiratory: Normal respiratory effort.  No retractions. Lungs CTAB. Gastrointestinal: Soft and nontender. No distention.  Not obviously gravid.  No tenderness on palpation. Vaginal: Escorted by nurse Marylene Land external exam normal.  Internal exam shows a very slight whitish discharge the just minimally abnormal but not fishy odor.  She does have some very slight friability of the cervix, but the office is closed.  There is no gross bleeding or blood in the vault.  No cervical motion tenderness. Musculoskeletal: No lower extremity tenderness nor edema. Neurologic:  Normal speech and language. No gross focal neurologic deficits are appreciated.  Skin:  Skin is warm,  dry and intact. No rash noted. Psychiatric: Mood and affect are normal. Speech and behavior are normal.  ____________________________________________   LABS (all labs ordered are listed, but only abnormal results are displayed)  Labs Reviewed  CHLAMYDIA/NGC RT PCR ([facility] ONLY) - Abnormal; Notable for the following components:      Result Value   N gonorrhoeae DETECTED (*)    All other components within normal limits  WET PREP, GENITAL - Abnormal; Notable for the following components:   Clue Cells Wet Prep HPF POC PRESENT (*)    WBC, Wet Prep HPF POC FEW (*)    All other components within normal limits  HCG, QUANTITATIVE, PREGNANCY - Abnormal; Notable for the following components:   hCG, Beta Chain, Quant, S 75,365 (*)    All other components within normal limits  COMPREHENSIVE METABOLIC PANEL - Abnormal; Notable for the following components:   Sodium 134 (*)    BUN <5 (*)    Calcium 8.7 (*)    All other components within normal limits  URINALYSIS, COMPLETE (UACMP) WITH MICROSCOPIC - Abnormal; Notable for the following components:   Color, Urine STRAW (*)    APPearance CLEAR (*)    Specific Gravity, Urine 1.004 (*)    All other components  within normal limits  CBC  ABO/RH   ____________________________________________  EKG   ____________________________________________  RADIOLOGY IMPRESSION: 1. Single living intrauterine embryo. 2. Size and dates correlate within 5 days. 3. No subchorionic hemorrhage or adnexal mass.   Ultrasound result reviewed by me  ____________________________________________   PROCEDURES  Procedure(s) performed: None  Procedures  Critical Care performed: No  ____________________________________________   INITIAL IMPRESSION / ASSESSMENT AND PLAN / ED COURSE  Pertinent labs & imaging results that were available during my care of the patient were reviewed by me and considered in my medical decision making (see chart for details).  Patient presents for vaginal spotting without pain.  She is hemodynamically stable, well-appearing with very reassuring lab work including hemoglobin, hCG as well as ultrasound that shows a single positive intrauterine pregnancy without noted complication.  Painless vaginal spotting during first trimester, etiology somewhat unclear, but very reassuring exam.      Clue cells noted, few white blood cells.  Possibly indicative of bacterial V vaginosis, will treat with Flagyl.  White discussed with patient she will follow-up closely with Dr. Dalbert Garnet.  Return precautions and treatment recommendations and follow-up discussed with the patient who is agreeable with the plan. ----------------------------------------- 5:04 PM on 09/29/2017 -----------------------------------------  Patient gonorrhea test positive.  She is returning to the emergency department for further treatment for positive gonorrhea test at this time. ____________________________________________   FINAL CLINICAL IMPRESSION(S) / ED DIAGNOSES  Final diagnoses:  BV (bacterial vaginosis)  Vaginal bleeding in pregnancy, first trimester      NEW MEDICATIONS STARTED DURING THIS  VISIT:  Discharge Medication List as of 09/29/2017  3:10 PM       Note:  This document was prepared using Dragon voice recognition software and may include unintentional dictation errors.     Sharyn Creamer, MD 09/29/17 1705

## 2017-09-29 NOTE — ED Notes (Signed)
Called lab, They will add on ABO/Rh.

## 2017-09-29 NOTE — Telephone Encounter (Addendum)
Called and informed patient of positive gonorrhea test and need to return for treatment.  She agrees to return this evening.  Also discussed partner treatment.   10/02/2017  12n--called [facility] nurse to inquire whether the ceftriaxone was adequate treatment for patient on Friday.  She said she should be treated again with the ceftriaxone as well as with azithromycin.  This can be done at [facility].  I called patient and asked her to call [facility] for appt today to be treated.  She agrees.  10/02/2017  335--patient has called twice to say she is having heavier bleeding.  She said water in toilet was red.  Denies any pain or cramping.  I told her that if she felt like she was bleeding a lot or if she felt worse, she can always return here for exam.  She does have appt with [facility] 7[facility].   She says she did go to [facility] for treatment of GC today.  When she called the second tome she says she is coming back here because of the heavier bleeding.

## 2017-09-29 NOTE — ED Triage Notes (Signed)
Pt c/o vaginal bleeding, scant amount when she wipes over the past 2 weeks. Denies any pain or painful urination.the patient states she is 11 weeks.

## 2017-09-29 NOTE — ED Triage Notes (Signed)
Positive STD test from earlier today. Called back to receive treatment. No changes in symptoms. Pt has returned for treatment.

## 2017-09-29 NOTE — ED Provider Notes (Signed)
[facility] Emergency Department Provider Note   ____________________________________________   First MD Initiated Contact with Patient 09/29/17 1711     (approximate)  I have reviewed the triage vital signs and the nursing notes.   HISTORY  Chief Complaint SEXUALLY TRANSMITTED DISEASE    HPI Jill Daniel is a 27 y.o. female patient return to ED for further STD treatment.  Patient was earlier diagnosed with BV and a prescription for Flagyl was given.  Results of chlamydia gonorrhea are not available which show she is positive for gonorrhea.  Past Medical History:  Diagnosis Date  . Anxiety   . Bipolar 1 disorder (HCC)   . Seizures [facility])     Patient Active Problem List   Diagnosis Date Noted  . Bipolar I disorder (HCC) 11/16/2015  . Panic attacks 11/16/2015    Past Surgical History:  Procedure Laterality Date  . ADENOIDECTOMY    . tailbone    . TONSILLECTOMY    . WISDOM TOOTH EXTRACTION      Prior to Admission medications   Medication Sig Start Date End Date Taking? Authorizing Provider  busPIRone (BUSPAR) 10 MG tablet Take 1 tablet (10 mg total) by mouth 2 (two) times daily. Patient not taking: Reported on 09/29/2017 10/09/15   Antony Madura, PA-C  cetirizine-pseudoephedrine (ZYRTEC-D) 5-120 MG tablet Take 1 tablet by mouth daily. Patient not taking: Reported on 05/01/2017 12/26/16   Belinda Fisher, PA-C  cyclobenzaprine (FLEXERIL) 10 MG tablet Take 1 tablet (10 mg total) by mouth 2 (two) times daily as needed for muscle spasms. Patient not taking: Reported on 05/01/2017 02/27/16   Isa Rankin, MD  doxycycline (VIBRAMYCIN) 100 MG capsule Take 1 capsule (100 mg total) by mouth 2 (two) times daily. Patient not taking: Reported on 05/01/2017 10/05/16   Deatra Canter, FNP  HYDROcodone-acetaminophen (NORCO/VICODIN) 5-325 MG tablet Take 2 tablets by mouth every 4 (four) hours as needed. Patient not taking: Reported on 05/01/2017 10/05/16   Deatra Canter, FNP  ibuprofen (ADVIL,MOTRIN) 600 MG tablet Take 1 tablet (600 mg total) by mouth every 6 (six) hours as needed. Patient not taking: Reported on 05/01/2017 11/03/15   Arby Barrette, MD  ipratropium (ATROVENT) 0.06 % nasal spray Place 2 sprays into both nostrils 4 (four) times daily. Patient not taking: Reported on 05/01/2017 12/26/16   Belinda Fisher, PA-C  lidocaine (LIDODERM) 5 % Place 1 patch onto the skin daily. Remove & Discard patch within 12 hours or as directed by MD Patient not taking: Reported on 09/29/2017 05/01/17   Demetrios Loll T, PA-C  Lurasidone HCl (LATUDA) 60 MG TABS Take 60 mg by mouth every evening. Patient not taking: Reported on 09/29/2017 10/09/15   Antony Madura, PA-C  metroNIDAZOLE (FLAGYL) 500 MG tablet Take 1 tablet (500 mg total) by mouth 2 (two) times daily. 09/29/17   Sharyn Creamer, MD  naproxen (NAPROSYN) 375 MG tablet Take 1 tablet (375 mg total) by mouth 2 (two) times daily. Patient not taking: Reported on 09/29/2017 05/01/17   Demetrios Loll T, PA-C  ondansetron (ZOFRAN ODT) 4 MG disintegrating tablet Take 1 tablet (4 mg total) by mouth every 8 (eight) hours as needed for nausea or vomiting. Patient not taking: Reported on 05/01/2017 10/05/16   Deatra Canter, FNP  traMADol (ULTRAM) 50 MG tablet Take 1 tablet (50 mg total) by mouth every 6 (six) hours as needed. Patient not taking: Reported on 09/29/2017 05/01/17   Rise Mu, PA-C  Allergies Decadron [dexamethasone]; Lactose intolerance (gi); Grapefruit extract; and Tape  Family History  Problem Relation Age of Onset  . Depression Mother   . Depression Sister     Social History Social History   Tobacco Use  . Smoking status: Former Smoker    Packs/day: 0.00  . Smokeless tobacco: Never Used  Substance Use Topics  . Alcohol use: No    Comment: Socially  . Drug use: No    Review of Systems  Constitutional: No fever/chills Eyes: No visual changes. ENT: No sore throat. Cardiovascular:  Denies chest pain. Respiratory: Denies shortness of breath. Gastrointestinal: No abdominal pain.  No nausea, no vomiting.  No diarrhea.  No constipation. Genitourinary: Negative for dysuria. Musculoskeletal: Negative for back pain. Skin: Negative for rash. Neurological: Negative for headaches, focal weakness or numbness. Psychiatric:Anxiety and bipolar Allergic/Immunilogical: See medication list. ____________________________________________   PHYSICAL EXAM:  VITAL SIGNS: ED Triage Vitals  Enc Vitals Group     BP 09/29/17 1707 111/77     Pulse Rate 09/29/17 1707 97     Resp 09/29/17 1707 16     Temp 09/29/17 1707 99 F (37.2 C)     Temp Source 09/29/17 1707 Oral     SpO2 09/29/17 1707 100 %     Weight 09/29/17 1708 157 lb (71.2 kg)     Height 09/29/17 1708 5\' 2"  (1.575 m)     Head Circumference --      Peak Flow --      Pain Score 09/29/17 1708 2     Pain Loc --      Pain Edu? --      Excl. in GC? --    Constitutional: Alert and oriented. Well appearing and in no acute distress. Cardiovascular: Normal rate, regular rhythm. Grossly normal heart sounds.  Good peripheral circulation. Respiratory: Normal respiratory effort.  No retractions. Lungs CTAB. Genitourinary: Deferred   ____________________________________________   LABS (all labs ordered are listed, but only abnormal results are displayed)  Labs Reviewed - No data to display ____________________________________________  EKG   ____________________________________________  RADIOLOGY  ED MD interpretation:    Official radiology report(s): US Ob Comp Less 14 Wks  Result Date: 09/29/2017 CLINICAL DATA:  Vaginal bleeding in early pregnancy. Light spotting. Patient reports 2 prior miscarriages. Gravida 3 para 0 quantitative beta HCG is 75, 365. By first ultrasound patient is 11 weeks 4 days. By previous ultrasound EDC is 04/16/2018. EXAM: OBSTETRIC <14 WK ULTRASOUND TECHNIQUE: Transabdominal ultrasound was  performed for evaluation of the gestation as well as the maternal uterus and adnexal regions. COMPARISON:  None. FINDINGS: Intrauterine gestational sac: Single Yolk sac:  Not Visualized. Embryo:  Visualized. Cardiac Activity: Visualized. Heart Rate: 169 bpm CRL:   56.4 mm   12 w 2 d                  [place] EDC: 04/11/2018 Subchorionic hemorrhage:  None visualized. Maternal uterus/adnexae: Normal appearance of the ovaries. No free pelvic fluid. IMPRESSION: 1. Single living intrauterine embryo. 2. Size and dates correlate within 5 days. 3. No subchorionic hemorrhage or adnexal mass. Electronically Signed   By: Norva Pavlov M.D.   On: 09/29/2017 12:30    ____________________________________________   PROCEDURES  Procedure(s) performed: None  Procedures  Critical Care performed: No  ____________________________________________   INITIAL IMPRESSION / ASSESSMENT AND PLAN / ED COURSE  As part of my medical decision making, I reviewed the following data within the electronic MEDICAL RECORD NUMBER  Patient return to ED secondary to confirm testing for gonorrhea.  Patient received Rocephin and advised to continue previous medications.      ____________________________________________   FINAL CLINICAL IMPRESSION(S) / ED DIAGNOSES  Final diagnoses:  Gonorrhea     ED Discharge Orders    None       Note:  This document was prepared using Dragon voice recognition software and may include unintentional dictation errors.    Joni ReiningSmith, Ronald K, PA-C 09/29/17 1740    Willy Eddyobinson, Patrick, MD 09/29/17 [phone]

## 2017-09-29 NOTE — ED Notes (Signed)
Pt is currently in ultrasound.

## 2017-10-02 ENCOUNTER — Encounter: Payer: Self-pay | Admitting: Emergency Medicine

## 2017-10-02 ENCOUNTER — Emergency Department
Admission: EM | Admit: 2017-10-02 | Discharge: 2017-10-02 | Disposition: A | Discharge status: Home / Self Care | Payer: Medicaid Other | Attending: Emergency Medicine | Admitting: Emergency Medicine

## 2017-10-02 DIAGNOSIS — Z3A12 12 weeks gestation of pregnancy: Secondary | ICD-10-CM | POA: Insufficient documentation

## 2017-10-02 DIAGNOSIS — Z87891 Personal history of nicotine dependence: Secondary | ICD-10-CM | POA: Diagnosis not present

## 2017-10-02 DIAGNOSIS — F419 Anxiety disorder, unspecified: Secondary | ICD-10-CM | POA: Diagnosis not present

## 2017-10-02 DIAGNOSIS — O99341 Other mental disorders complicating pregnancy, first trimester: Secondary | ICD-10-CM | POA: Insufficient documentation

## 2017-10-02 DIAGNOSIS — F319 Bipolar disorder, unspecified: Secondary | ICD-10-CM | POA: Diagnosis not present

## 2017-10-02 DIAGNOSIS — O4690 Antepartum hemorrhage, unspecified, unspecified trimester: Secondary | ICD-10-CM

## 2017-10-02 DIAGNOSIS — O209 Hemorrhage in early pregnancy, unspecified: Secondary | ICD-10-CM | POA: Insufficient documentation

## 2017-10-02 LAB — CBC
HCT: 36.2 % (ref 35.0–47.0)
HEMOGLOBIN: 12.4 g/dL (ref 12.0–16.0)
MCH: 30.1 pg (ref 26.0–34.0)
MCHC: 34.3 g/dL (ref 32.0–36.0)
MCV: 87.6 fL (ref 80.0–100.0)
PLATELETS: 299 10*3/uL (ref 150–440)
RBC: 4.13 MIL/uL (ref 3.80–5.20)
RDW: 13.5 % (ref 11.5–14.5)
WBC: 11.8 10*3/uL — AB (ref 3.6–11.0)

## 2017-10-02 LAB — HCG, QUANTITATIVE, PREGNANCY: hCG, Beta Chain, Quant, S: 71799 m[IU]/mL — ABNORMAL HIGH (ref <5)

## 2017-10-02 NOTE — ED Triage Notes (Signed)
Pt reports was seen here last week for spotting while pregnant, was dx with gonorrhea but only give one medication for it. Pt reports not she is bleeding heavier with clots. Pt reports is 12 weeks and 6 days pregnant with a hx of miscarriage.

## 2017-10-02 NOTE — ED Provider Notes (Signed)
[facility] Emergency Department Provider Note  ____________________________________________  Time seen: Approximately 4:49 PM  I have reviewed the triage vital signs and the nursing notes.   HISTORY  Chief Complaint Vaginal Bleeding   HPI Jill Daniel is a 27 y.o. female with a history as listed below who presents for evaluation of vaginal bleeding.  Patient was seen yesterday for spotting while pregnant.  She was diagnosed with gonorrhea.  She was treated for it.  She reports for the last 2 days she has had mildly increased vaginal bleeding which is present only when she uriantes and has passed some small clots.  No abdominal pain, no dizziness, no chest pain.  Patient reports 2 prior miscarriages.  She had an ultrasound here 3 days ago showing a 12-week IUP.   Past Medical History:  Diagnosis Date  . Anxiety   . Bipolar 1 disorder (HCC)   . Seizures [facility](HCC)     Patient Active Problem List   Diagnosis Date Noted  . Bipolar I disorder (HCC) 11/16/2015  . Panic attacks 11/16/2015    Past Surgical History:  Procedure Laterality Date  . ADENOIDECTOMY    . tailbone    . TONSILLECTOMY    . WISDOM TOOTH EXTRACTION      Prior to Admission medications   Medication Sig Start Date End Date Taking? Authorizing Provider  busPIRone (BUSPAR) 10 MG tablet Take 1 tablet (10 mg total) by mouth 2 (two) times daily. Patient not taking: Reported on 09/29/2017 10/09/15   Antony MaduraHumes, Kelly, PA-C  cetirizine-pseudoephedrine (ZYRTEC-D) 5-120 MG tablet Take 1 tablet by mouth daily. Patient not taking: Reported on 05/01/2017 12/26/16   Belinda FisherYu, Amy V, PA-C  cyclobenzaprine (FLEXERIL) 10 MG tablet Take 1 tablet (10 mg total) by mouth 2 (two) times daily as needed for muscle spasms. Patient not taking: Reported on 05/01/2017 02/27/16   Isa RankinSmith, Ann B, MD  doxycycline (VIBRAMYCIN) 100 MG capsule Take 1 capsule (100 mg total) by mouth 2 (two) times daily. Patient not taking: Reported on  05/01/2017 10/05/16   Deatra Canterxford, William J, FNP  HYDROcodone-acetaminophen (NORCO/VICODIN) 5-325 MG tablet Take 2 tablets by mouth every 4 (four) hours as needed. Patient not taking: Reported on 05/01/2017 10/05/16   Deatra Canterxford, William J, FNP  ibuprofen (ADVIL,MOTRIN) 600 MG tablet Take 1 tablet (600 mg total) by mouth every 6 (six) hours as needed. Patient not taking: Reported on 05/01/2017 11/03/15   Arby BarrettePfeiffer, Marcy, MD  ipratropium (ATROVENT) 0.06 % nasal spray Place 2 sprays into both nostrils 4 (four) times daily. Patient not taking: Reported on 05/01/2017 12/26/16   Belinda FisherYu, Amy V, PA-C  lidocaine (LIDODERM) 5 % Place 1 patch onto the skin daily. Remove & Discard patch within 12 hours or as directed by MD Patient not taking: Reported on 09/29/2017 05/01/17   Demetrios LollLeaphart, Kenneth T, PA-C  Lurasidone HCl (LATUDA) 60 MG TABS Take 60 mg by mouth every evening. Patient not taking: Reported on 09/29/2017 10/09/15   Antony MaduraHumes, Kelly, PA-C  metroNIDAZOLE (FLAGYL) 500 MG tablet Take 1 tablet (500 mg total) by mouth 2 (two) times daily. 09/29/17   Sharyn CreamerQuale, Mark, MD  naproxen (NAPROSYN) 375 MG tablet Take 1 tablet (375 mg total) by mouth 2 (two) times daily. Patient not taking: Reported on 09/29/2017 05/01/17   Demetrios LollLeaphart, Kenneth T, PA-C  ondansetron (ZOFRAN ODT) 4 MG disintegrating tablet Take 1 tablet (4 mg total) by mouth every 8 (eight) hours as needed for nausea or vomiting. Patient not taking: Reported on  05/01/2017 10/05/16   Deatra Canter, FNP  traMADol (ULTRAM) 50 MG tablet Take 1 tablet (50 mg total) by mouth every 6 (six) hours as needed. Patient not taking: Reported on 09/29/2017 05/01/17   Demetrios Loll T, PA-C    Allergies Decadron [dexamethasone]; Lactose intolerance (gi); Grapefruit extract; and Tape  Family History  Problem Relation Age of Onset  . Depression Mother   . Depression Sister     Social History Social History   Tobacco Use  . Smoking status: Former Smoker    Packs/day: 0.00  . Smokeless  tobacco: Never Used  Substance Use Topics  . Alcohol use: No    Comment: Socially  . Drug use: No    Review of Systems  Constitutional: Negative for fever. Eyes: Negative for visual changes. ENT: Negative for sore throat. Neck: No neck pain  Cardiovascular: Negative for chest pain. Respiratory: Negative for shortness of breath. Gastrointestinal: Negative for abdominal pain, vomiting or diarrhea. Genitourinary: Negative for dysuria. + vaginal bleeding Musculoskeletal: Negative for back pain. Skin: Negative for rash. Neurological: Negative for headaches, weakness or numbness. Psych: No SI or HI  ____________________________________________   PHYSICAL EXAM:  VITAL SIGNS: ED Triage Vitals  Enc Vitals Group     BP 10/02/17 1559 137/87     Pulse Rate 10/02/17 1559 69     Resp 10/02/17 1559 20     Temp 10/02/17 1559 98.8 F (37.1 C)     Temp Source 10/02/17 1559 Oral     SpO2 10/02/17 1559 100 %     Weight 10/02/17 1559 157 lb (71.2 kg)     Height 10/02/17 1559 5\' 2"  (1.575 m)     Head Circumference --      Peak Flow --      Pain Score 10/02/17 1606 0     Pain Loc --      Pain Edu? --      Excl. in GC? --     Constitutional: Alert and oriented. Well appearing and in no apparent distress. HEENT:      Head: Normocephalic and atraumatic.         Eyes: Conjunctivae are normal. Sclera is non-icteric.       Mouth/Throat: Mucous membranes are moist.       Neck: Supple with no signs of meningismus. Cardiovascular: Regular rate and rhythm. No murmurs, gallops, or rubs. 2+ symmetrical distal pulses are present in all extremities. No JVD. Respiratory: Normal respiratory effort. Lungs are clear to auscultation bilaterally. No wheezes, crackles, or rhonchi.  Gastrointestinal: Soft, non tender, and non distended with positive bowel sounds. No rebound or guarding. Genitourinary: No CVA tenderness. Musculoskeletal: Nontender with normal range of motion in all extremities. No edema,  cyanosis, or erythema of extremities. Neurologic: Normal speech and language. Face is symmetric. Moving all extremities. No gross focal neurologic deficits are appreciated. Skin: Skin is warm, dry and intact. No rash noted. Psychiatric: Mood and affect are normal. Speech and behavior are normal.  ____________________________________________   LABS (all labs ordered are listed, but only abnormal results are displayed)  Labs Reviewed  HCG, QUANTITATIVE, PREGNANCY - Abnormal; Notable for the following components:      Result Value   hCG, Beta Chain, Quant, S 71,799 (*)    All other components within normal limits  CBC - Abnormal; Notable for the following components:   WBC 11.8 (*)    All other components within normal limits   ____________________________________________  EKG  none  ____________________________________________  RADIOLOGY  none  ____________________________________________   PROCEDURES  Procedure(s) performed: None Procedures Critical Care performed:  None ____________________________________________   INITIAL IMPRESSION / ASSESSMENT AND PLAN / ED COURSE  27 y.o. female with a history as listed below who presents for evaluation of vaginal bleeding.  Patient is currently [redacted] weeks pregnant.  Bedside ultrasound showing IUP with good fetal movement and fetal heart rate of 162.  Patient recently diagnosed with gonorrhea 3 days ago and has been fully treated with p.o. azithromycin and IM Rocephin.  Discussed pelvic rest and recommended close follow-up with OB/GYN in 10 days for repeat ultrasound.  I explained to the patient that her bleeding can be due to the recently diagnosed gonorrhea versus first trimester bleed but there is also a chance of possible miscarriage therefore it is important that she follows up with her OB/GYN.  Labs showing stable hemoglobin.       As part of my medical decision making, I reviewed the following data within the electronic  MEDICAL RECORD NUMBER Nursing notes reviewed and incorporated, Labs reviewed , Old chart reviewed, Radiograph reviewed , Notes from prior ED visits and [place] Controlled Substance Database    Pertinent labs & imaging results that were available during my care of the patient were reviewed by me and considered in my medical decision making (see chart for details).    ____________________________________________   FINAL CLINICAL IMPRESSION(S) / ED DIAGNOSES  Final diagnoses:  Vaginal bleeding in pregnancy      NEW MEDICATIONS STARTED DURING THIS VISIT:  ED Discharge Orders    None       Note:  This document was prepared using Dragon voice recognition software and may include unintentional dictation errors.    Don Perking, [place], MD 10/02/17 [phone]

## 2017-10-11 ENCOUNTER — Other Ambulatory Visit: Payer: Self-pay | Admitting: Certified Nurse Midwife

## 2017-10-11 DIAGNOSIS — Z369 Encounter for antenatal screening, unspecified: Secondary | ICD-10-CM

## 2017-10-12 ENCOUNTER — Ambulatory Visit: Payer: Medicaid Other

## 2017-11-04 IMAGING — US US PELVIS COMPLETE
1 series · 13 of 25 positions shown · non-contrast
Comparison: CT Abdomen and Pelvis 10/18/2015. Pelvis ultrasound
10/20/2015.

CLINICAL DATA: 24-year-old female with increase generalized pelvic
pain radiating to the back since [REDACTED]. Brown discharge. Treated for
pelvic inflammatory disease in [REDACTED]. Subsequent encounter.



[Series 1: us pelvis complete · 0.24mm/px · 125 acquisitions, 13 frames shown]
[im 1/125]
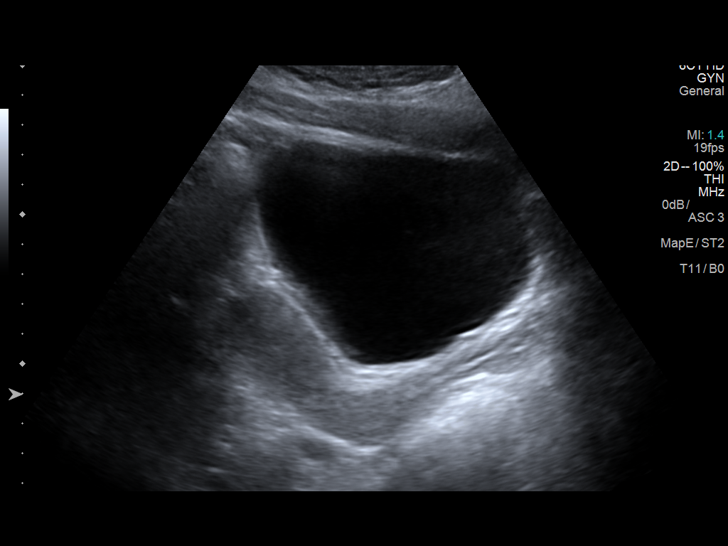
[im 11/125]
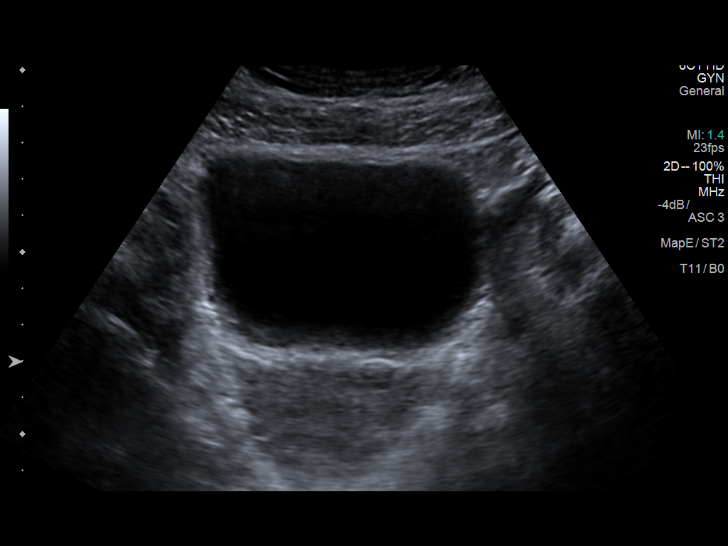
[im 21/125]
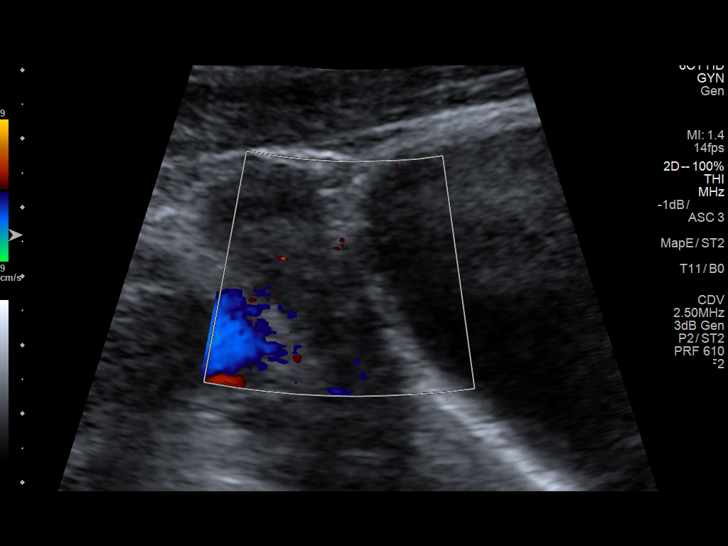
[im 32/125]
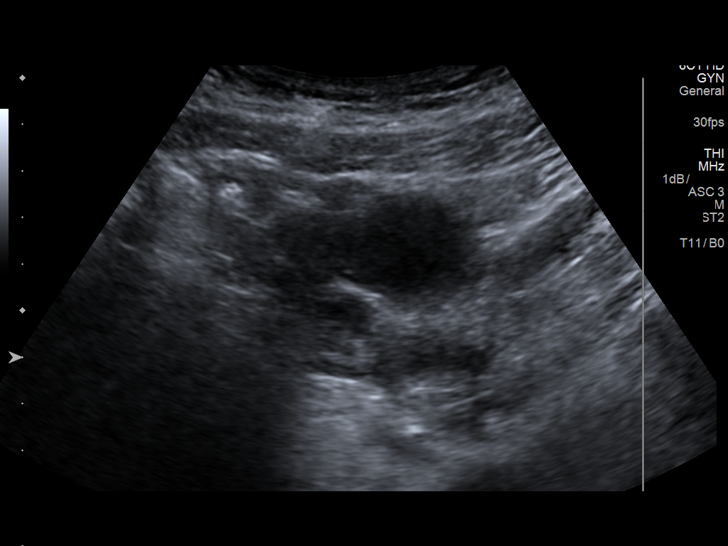
[im 42/125]
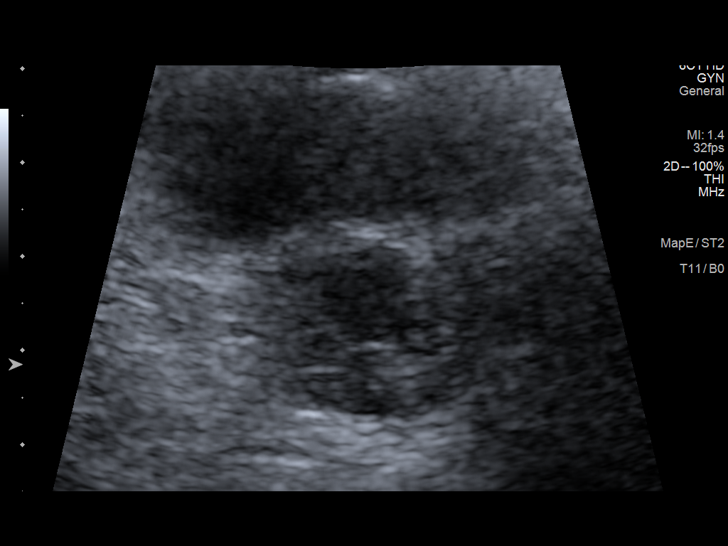
[im 52/125]
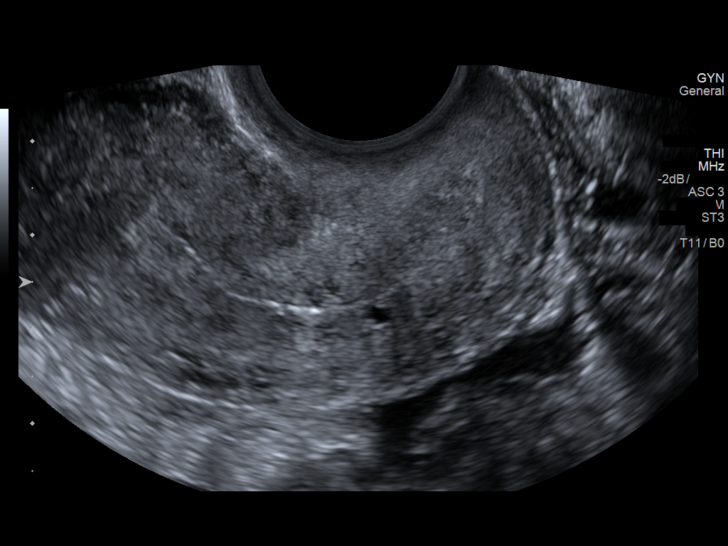
[im 63/125]
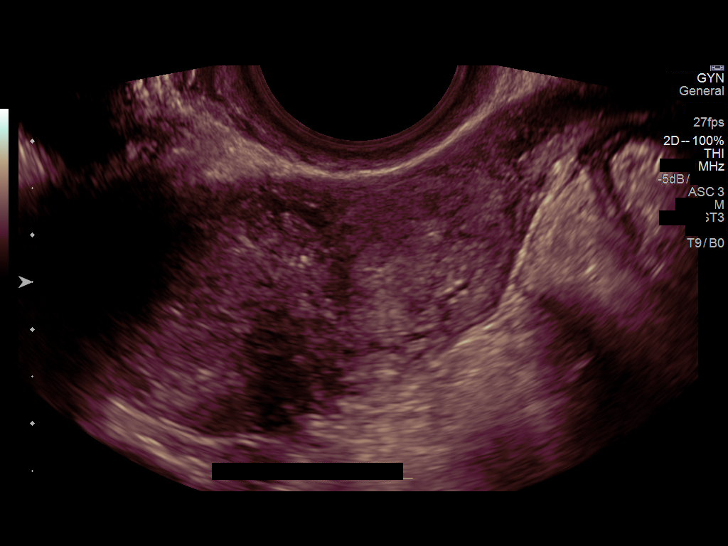
[im 73/125]
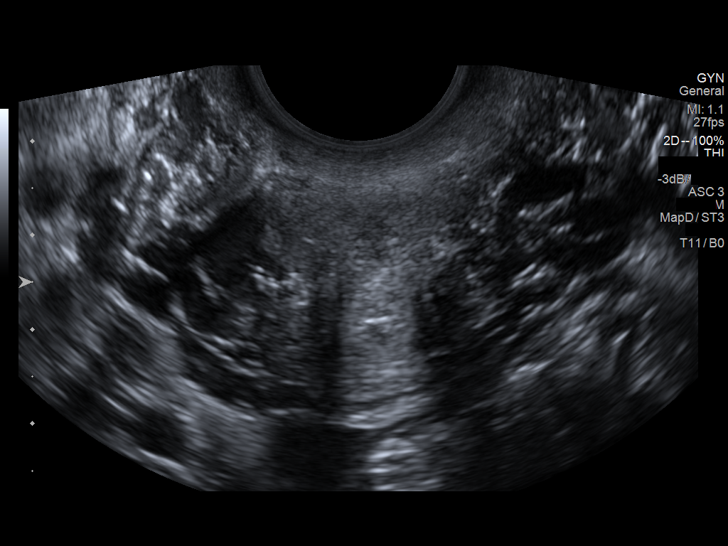
[im 83/125]
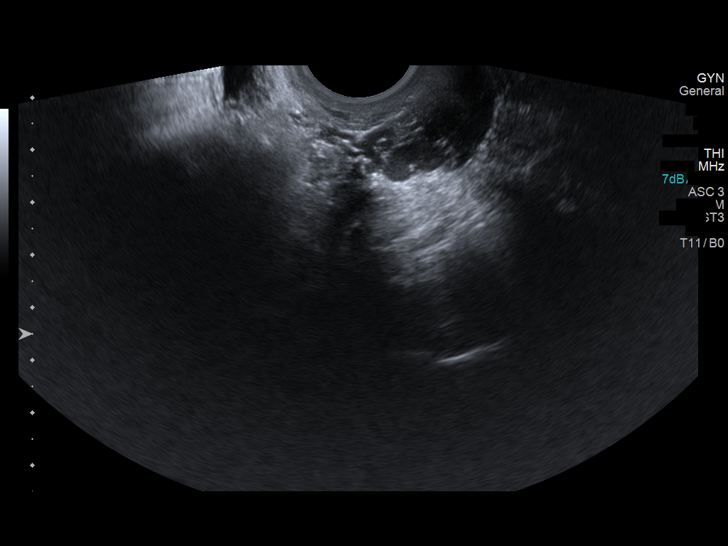
[im 94/125]
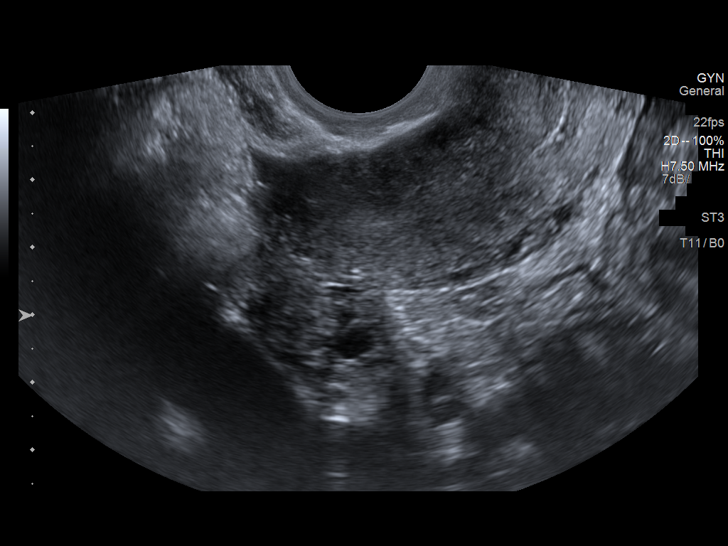
[im 104/125]
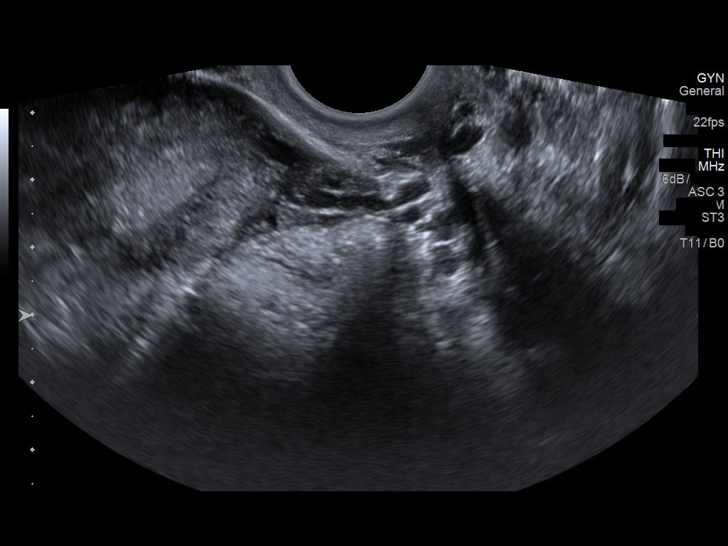
[im 114/125]
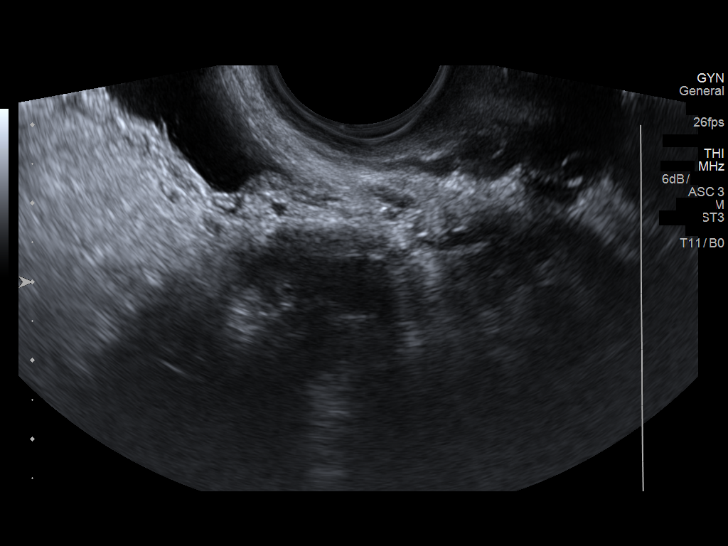
[im 125/125]
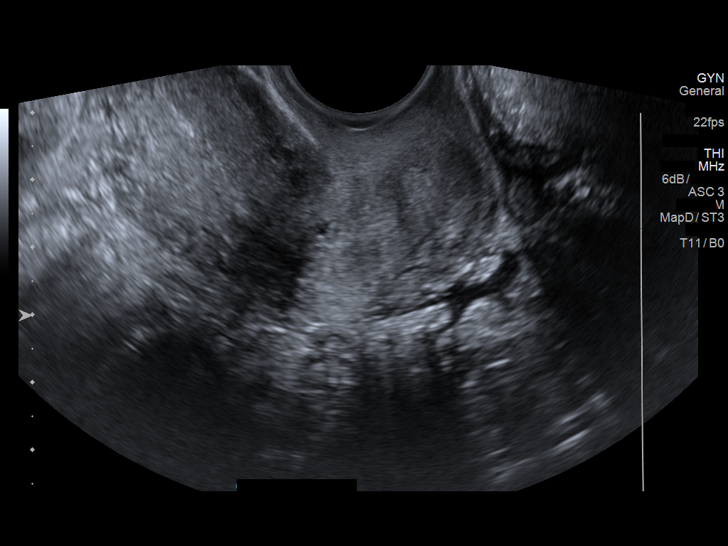

[13 of 25 positions shown; findings below may reference images not displayed]

FINDINGS: Uterus

Measurements: 7.1 x 2.9 x 4.6 cm. No fibroids or other mass
visualized. Bicornuate uterine configuration again noted.

Endometrium

Thickness: 5 mm.  No focal abnormality visualized.

Right ovary

Measurements: 3.0 x 1.8 x 2.0 cm. Several small follicles. Normal
appearance/no adnexal mass.

Left ovary

Measurements: 2.7 x 2.2 x 1.8 cm. Collapsing cyst or follicle
suspected (image 120). Normal appearance/no adnexal mass.

Other findings

Trace simple appearing free fluid.
IMPRESSION: Continued physiologic ultrasound appearance of the pelvis.
Incidental bicornuate uterus.

## 2018-01-21 ENCOUNTER — Observation Stay: Payer: Medicaid Other

## 2018-01-21 ENCOUNTER — Inpatient Hospital Stay
Admission: EM | Admit: 2018-01-21 | Discharge: 2018-01-21 | DRG: 832 | Disposition: A | Discharge status: Other Acute Inpatient Hospital | Payer: Medicaid Other | Attending: Obstetrics and Gynecology | Admitting: Obstetrics and Gynecology

## 2018-01-21 DIAGNOSIS — Z79899 Other long term (current) drug therapy: Secondary | ICD-10-CM

## 2018-01-21 DIAGNOSIS — Z87891 Personal history of nicotine dependence: Secondary | ICD-10-CM

## 2018-01-21 DIAGNOSIS — O99343 Other mental disorders complicating pregnancy, third trimester: Secondary | ICD-10-CM | POA: Diagnosis present

## 2018-01-21 DIAGNOSIS — F319 Bipolar disorder, unspecified: Secondary | ICD-10-CM | POA: Diagnosis present

## 2018-01-21 DIAGNOSIS — O42919 Preterm premature rupture of membranes, unspecified as to length of time between rupture and onset of labor, unspecified trimester: Secondary | ICD-10-CM | POA: Diagnosis present

## 2018-01-21 DIAGNOSIS — Z818 Family history of other mental and behavioral disorders: Secondary | ICD-10-CM

## 2018-01-21 DIAGNOSIS — O98213 Gonorrhea complicating pregnancy, third trimester: Secondary | ICD-10-CM | POA: Diagnosis present

## 2018-01-21 DIAGNOSIS — Z3A28 28 weeks gestation of pregnancy: Secondary | ICD-10-CM | POA: Diagnosis not present

## 2018-01-21 DIAGNOSIS — Z91018 Allergy to other foods: Secondary | ICD-10-CM | POA: Diagnosis not present

## 2018-01-21 DIAGNOSIS — Z91048 Other nonmedicinal substance allergy status: Secondary | ICD-10-CM

## 2018-01-21 DIAGNOSIS — O4693 Antepartum hemorrhage, unspecified, third trimester: Secondary | ICD-10-CM | POA: Diagnosis present

## 2018-01-21 DIAGNOSIS — Z888 Allergy status to other drugs, medicaments and biological substances status: Secondary | ICD-10-CM | POA: Diagnosis not present

## 2018-01-21 DIAGNOSIS — O42913 Preterm premature rupture of membranes, unspecified as to length of time between rupture and onset of labor, third trimester: Principal | ICD-10-CM | POA: Diagnosis present

## 2018-01-21 LAB — ROM PLUS (ARMC ONLY): Rom Plus: POSITIVE

## 2018-01-21 LAB — PLATELET COUNT: PLATELETS: 279 10*3/uL (ref 150–400)

## 2018-01-21 LAB — URINALYSIS, ROUTINE W REFLEX MICROSCOPIC
BILIRUBIN URINE: NEGATIVE
Glucose, UA: NEGATIVE mg/dL
Hgb urine dipstick: NEGATIVE
KETONES UR: NEGATIVE mg/dL
Leukocytes, UA: NEGATIVE
Nitrite: NEGATIVE
PROTEIN: NEGATIVE mg/dL
Specific Gravity, Urine: 1.02 (ref 1.005–1.030)
Specific Gravity, Urine: 1.02 (ref 1.005–1.030)
pH: 8.5 — ABNORMAL HIGH (ref 5.0–8.0)

## 2018-01-21 LAB — CBC WITH DIFFERENTIAL/PLATELET
ABS IMMATURE GRANULOCYTES: 0.06 10*3/uL (ref 0.00–0.07)
BASOS PCT: 0 %
Basophils Absolute: 0.1 10*3/uL (ref 0.0–0.1)
Eosinophils Absolute: 0.3 10*3/uL (ref 0.0–0.5)
Eosinophils Relative: 3 %
HCT: 33.3 % — ABNORMAL LOW (ref 36.0–46.0)
HEMOGLOBIN: 11 g/dL — AB (ref 12.0–15.0)
Immature Granulocytes: 1 %
Lymphocytes Relative: 20 %
Lymphs Abs: 2.2 10*3/uL (ref 0.7–4.0)
MCH: 28.7 pg (ref 26.0–34.0)
MCHC: 33 g/dL (ref 30.0–36.0)
MCV: 86.9 fL (ref 80.0–100.0)
MONOS PCT: 6 %
Monocytes Absolute: 0.6 10*3/uL (ref 0.1–1.0)
NEUTROS ABS: 7.9 10*3/uL — AB (ref 1.7–7.7)
NEUTROS PCT: 70 %
PLATELETS: 269 10*3/uL (ref 150–400)
RBC: 3.83 MIL/uL — ABNORMAL LOW (ref 3.87–5.11)
RDW: 12.7 % (ref 11.5–15.5)
WBC: 11.1 10*3/uL — AB (ref 4.0–10.5)
nRBC: 0 % (ref 0.0–0.2)

## 2018-01-21 LAB — WET PREP, GENITAL
Clue Cells Wet Prep HPF POC: NONE SEEN
SPERM: NONE SEEN
Trich, Wet Prep: NONE SEEN
WBC WET PREP: NONE SEEN
YEAST WET PREP: NONE SEEN

## 2018-01-21 LAB — URINE DRUG SCREEN, QUALITATIVE (ARMC ONLY)
AMPHETAMINES, UR SCREEN: NOT DETECTED
BARBITURATES, UR SCREEN: NOT DETECTED
BENZODIAZEPINE, UR SCRN: NOT DETECTED
Cannabinoid 50 Ng, Ur ~~LOC~~: POSITIVE — AB
Cocaine Metabolite,Ur ~~LOC~~: NOT DETECTED
MDMA (Ecstasy)Ur Screen: NOT DETECTED
Methadone Scn, Ur: NOT DETECTED
Opiate, Ur Screen: NOT DETECTED
PHENCYCLIDINE (PCP) UR S: NOT DETECTED
Tricyclic, Ur Screen: NOT DETECTED

## 2018-01-21 LAB — KLEIHAUER-BETKE STAIN
Fetal Cells %: 0 %
Quantitation Fetal Hemoglobin: 0 mL

## 2018-01-21 LAB — URINALYSIS, MICROSCOPIC (REFLEX)
Bacteria, UA: NONE SEEN
Squamous Epithelial / LPF: NONE SEEN (ref 0–5)

## 2018-01-21 LAB — FIBRIN DERIVATIVES D-DIMER (ARMC ONLY): Fibrin derivatives D-dimer (ARMC): 686.33 ng/mL (FEU) — ABNORMAL HIGH (ref 0.00–499.00)

## 2018-01-21 LAB — FIBRINOGEN: Fibrinogen: 438 mg/dL (ref 210–475)

## 2018-01-21 LAB — CHLAMYDIA/NGC RT PCR (ARMC ONLY)
Chlamydia Tr: NOT DETECTED
N gonorrhoeae: DETECTED — AB

## 2018-01-21 LAB — TYPE AND SCREEN
ABO/RH(D): O POS
ANTIBODY SCREEN: NEGATIVE

## 2018-01-21 LAB — PROTIME-INR
INR: 0.9
Prothrombin Time: 12.1 seconds (ref 11.4–15.2)

## 2018-01-21 LAB — APTT: aPTT: 28 seconds (ref 24–36)

## 2018-01-21 LAB — GROUP B STREP BY PCR: Group B strep by PCR: NEGATIVE

## 2018-01-21 MED ORDER — AZITHROMYCIN 500 MG PO TABS
500.0000 mg | ORAL_TABLET | Freq: Every day | ORAL | Status: DC
  Filled 2018-01-21: qty 1

## 2018-01-21 MED ORDER — STERILE WATER FOR INJECTION IJ SOLN
INTRAMUSCULAR | Status: AC
  Administered 2018-01-21: 17:00:00
  Filled 2018-01-21: qty 10

## 2018-01-21 MED ORDER — AMOXICILLIN 500 MG PO CAPS
500.0000 mg | ORAL_CAPSULE | Freq: Three times a day (TID) | ORAL | Status: DC
  Filled 2018-01-21: qty 1

## 2018-01-21 MED ORDER — ACETAMINOPHEN 325 MG PO TABS
ORAL_TABLET | ORAL | Status: AC
  Administered 2018-01-21: 650 mg via ORAL
  Filled 2018-01-21: qty 2

## 2018-01-21 MED ORDER — MAGNESIUM SULFATE 40 G IN LACTATED RINGERS - SIMPLE
2.0000 g/h | INTRAVENOUS | Status: DC
  Administered 2018-01-21: 2 g/h via INTRAVENOUS

## 2018-01-21 MED ORDER — MAGNESIUM SULFATE 40 G IN LACTATED RINGERS - SIMPLE
INTRAVENOUS | Status: AC
  Administered 2018-01-21: 6 mg via INTRAVENOUS
  Filled 2018-01-21: qty 500

## 2018-01-21 MED ORDER — ACETAMINOPHEN 325 MG PO TABS
650.0000 mg | ORAL_TABLET | Freq: Once | ORAL | Status: AC
  Administered 2018-01-21: 650 mg via ORAL

## 2018-01-21 MED ORDER — SODIUM CHLORIDE 0.9 % IV SOLN
2.0000 g | Freq: Four times a day (QID) | INTRAVENOUS | Status: DC
  Administered 2018-01-21: 2 g via INTRAVENOUS

## 2018-01-21 MED ORDER — SODIUM CHLORIDE 0.9 % IV SOLN
500.0000 mg | INTRAVENOUS | Status: DC
  Administered 2018-01-21: 500 mg via INTRAVENOUS
  Filled 2018-01-21: qty 500

## 2018-01-21 MED ORDER — MAGNESIUM SULFATE BOLUS VIA INFUSION
6.0000 g | Freq: Once | INTRAVENOUS | Status: AC
  Administered 2018-01-21: 6 mg via INTRAVENOUS
  Filled 2018-01-21: qty 500

## 2018-01-21 MED ORDER — SODIUM CHLORIDE 0.9 % IV SOLN
INTRAVENOUS | Status: AC
  Administered 2018-01-21: 2 g via INTRAVENOUS
  Filled 2018-01-21: qty 2000

## 2018-01-21 MED ORDER — BETAMETHASONE SOD PHOS & ACET 6 (3-3) MG/ML IJ SUSP
INTRAMUSCULAR | Status: AC
  Administered 2018-01-21: 12 mg via INTRAMUSCULAR
  Filled 2018-01-21: qty 5

## 2018-01-21 MED ORDER — BETAMETHASONE SOD PHOS & ACET 6 (3-3) MG/ML IJ SUSP
12.0000 mg | INTRAMUSCULAR | Status: DC
  Administered 2018-01-21: 12 mg via INTRAMUSCULAR

## 2018-01-21 MED ORDER — LACTATED RINGERS IV SOLN
INTRAVENOUS | Status: DC
  Administered 2018-01-21: 14:00:00 via INTRAVENOUS

## 2018-01-21 MED ORDER — CEFTRIAXONE SODIUM 250 MG IJ SOLR
250.0000 mg | Freq: Once | INTRAMUSCULAR | Status: AC
  Administered 2018-01-21: 250 mg via INTRAMUSCULAR
  Filled 2018-01-21 (×3): qty 250

## 2018-01-21 NOTE — H&P (Signed)
OB History & Physical   History of Present Illness:  Chief Complaint: vaginal bleeding  HPI:  Jill Daniel is a 27 y.o. G3P0020 female at [redacted]w[redacted]d dated by [redacted]w[redacted]d ultrasound on 09/29/17 at ED.  She presents to L&D for vaginal bleeding that started around 1130 to noon. Reports intercourse last on 10/18 evening. Reports having some occasional ligament pain mostly on left side but no UCs. Reports good FM.   Pregnancy Issues:  1. Gonorrhea - Treated in the ED 09/29/17 with Azithromycin and Rocephin - 11/09/17- Exam and wet prep c/w Cervicitis, Rx azithro and rocephin 2. Marginal Previa with possible extra placenta lobe noted.  - VB at 17+3wks; on pelvic rest - Resolved at anatomy scan: cervical edge 3.5cm from os. Pelvic rest lifted  2. Bipolar disorder - Previously on Latuda 60mg  daily and Buspar 10mg  daily, stopped when she found out she was pregnant   Maternal Medical History:   Past Medical History:  Diagnosis Date  . Anxiety   . Bipolar 1 disorder (HCC)   . Seizures (HCC)     Past Surgical History:  Procedure Laterality Date  . ADENOIDECTOMY    . tailbone    . TONSILLECTOMY    . WISDOM TOOTH EXTRACTION      Allergies  Allergen Reactions  . Decadron [Dexamethasone] Other (See Comments)    "It interacts bad with my bipolar and makes me go crazy" = Psychosis  . Grapefruit Extract Other (See Comments)    Cannot take with latuda  . Tape Rash    NO PLASTIC TAPE, PLEASE!! (Paper tape preferred)    Prior to Admission medications   Medication Sig Start Date End Date Taking? Authorizing Provider  busPIRone (BUSPAR) 10 MG tablet Take 1 tablet (10 mg total) by mouth 2 (two) times daily. Patient not taking: Reported on 09/29/2017 10/09/15   Antony Madura, PA-C  cetirizine-pseudoephedrine (ZYRTEC-D) 5-120 MG tablet Take 1 tablet by mouth daily. Patient not taking: Reported on 05/01/2017 12/26/16   Belinda Fisher, PA-C  cyclobenzaprine (FLEXERIL) 10 MG tablet Take 1 tablet (10 mg total) by  mouth 2 (two) times daily as needed for muscle spasms. Patient not taking: Reported on 05/01/2017 02/27/16   Isa Rankin, MD  doxycycline (VIBRAMYCIN) 100 MG capsule Take 1 capsule (100 mg total) by mouth 2 (two) times daily. Patient not taking: Reported on 05/01/2017 10/05/16   Deatra Canter, FNP  HYDROcodone-acetaminophen (NORCO/VICODIN) 5-325 MG tablet Take 2 tablets by mouth every 4 (four) hours as needed. Patient not taking: Reported on 05/01/2017 10/05/16   Deatra Canter, FNP  ibuprofen (ADVIL,MOTRIN) 600 MG tablet Take 1 tablet (600 mg total) by mouth every 6 (six) hours as needed. Patient not taking: Reported on 05/01/2017 11/03/15   Arby Barrette, MD  ipratropium (ATROVENT) 0.06 % nasal spray Place 2 sprays into both nostrils 4 (four) times daily. Patient not taking: Reported on 05/01/2017 12/26/16   Belinda Fisher, PA-C  lidocaine (LIDODERM) 5 % Place 1 patch onto the skin daily. Remove & Discard patch within 12 hours or as directed by MD Patient not taking: Reported on 09/29/2017 05/01/17   Demetrios Loll T, PA-C  Lurasidone HCl (LATUDA) 60 MG TABS Take 60 mg by mouth every evening. Patient not taking: Reported on 09/29/2017 10/09/15   Antony Madura, PA-C  metroNIDAZOLE (FLAGYL) 500 MG tablet Take 1 tablet (500 mg total) by mouth 2 (two) times daily. 09/29/17   Sharyn Creamer, MD  naproxen (NAPROSYN) 375 MG tablet Take  1 tablet (375 mg total) by mouth 2 (two) times daily. Patient not taking: Reported on 09/29/2017 05/01/17   Demetrios Loll T, PA-C  ondansetron (ZOFRAN ODT) 4 MG disintegrating tablet Take 1 tablet (4 mg total) by mouth every 8 (eight) hours as needed for nausea or vomiting. Patient not taking: Reported on 05/01/2017 10/05/16   Deatra Canter, FNP  traMADol (ULTRAM) 50 MG tablet Take 1 tablet (50 mg total) by mouth every 6 (six) hours as needed. Patient not taking: Reported on 09/29/2017 05/01/17   Wallace Keller     Prenatal care site: [facility]   Social  History: She  reports that she has quit smoking. She smoked 0.00 packs per day. She has never used smokeless tobacco. She reports that she does not drink alcohol or use drugs.  Family History: family history includes Depression in her mother and sister.   Review of Systems: A full review of systems was performed and negative except as noted in the HPI.     Physical Exam:  Vital Signs: BP 134/74 (BP Location: Right Arm)   Pulse 87   Resp 18   LMP 07/04/2017 (Approximate)    General: no acute distress but notably upset and appropriately concerned.   HEENT: normocephalic, atraumatic Heart: regular rate & rhythm.  No murmurs/rubs/gallops Lungs: clear to auscultation bilaterally, normal respiratory effort Abdomen: soft, gravid, non-tender Pelvic:   External: Normal external female genitalia  Cervix: visually closed on SSE.  SSE done: Copious amt bright red watery blood poured out of speculum during insertion onto chux pad, making dinner plate sized stain on chux pad, with 2 large clots approximately size of lemon. [place] Q-tips used to clear vaginal vault and visualize cervix, no pooling noted. ROM Plus, GC/CT and wet prep sent.    Extremities: non-tender, symmetric, No edema bilaterally.  DTRs: 2+  Neurologic: Alert & oriented x 3.    Results for orders placed or performed during the hospital encounter of 01/21/18 (from the past 24 hour(s))  Urinalysis, Routine w reflex microscopic     Status: Abnormal   Collection Time: 01/21/18  1:10 PM  Result Value Ref Range   Color, Urine RED (A) YELLOW   APPearance HAZY (A) CLEAR   Specific Gravity, Urine 1.020 1.005 - 1.030   pH  5.0 - 8.0    TEST NOT REPORTED DUE TO COLOR INTERFERENCE OF URINE PIGMENT   Glucose, UA (A) NEGATIVE mg/dL    TEST NOT REPORTED DUE TO COLOR INTERFERENCE OF URINE PIGMENT   Hgb urine dipstick (A) NEGATIVE    TEST NOT REPORTED DUE TO COLOR INTERFERENCE OF URINE PIGMENT   Bilirubin Urine (A) NEGATIVE    TEST NOT  REPORTED DUE TO COLOR INTERFERENCE OF URINE PIGMENT   Ketones, ur (A) NEGATIVE mg/dL    TEST NOT REPORTED DUE TO COLOR INTERFERENCE OF URINE PIGMENT   Protein, ur (A) NEGATIVE mg/dL    TEST NOT REPORTED DUE TO COLOR INTERFERENCE OF URINE PIGMENT   Nitrite (A) NEGATIVE    TEST NOT REPORTED DUE TO COLOR INTERFERENCE OF URINE PIGMENT   Leukocytes, UA (A) NEGATIVE    TEST NOT REPORTED DUE TO COLOR INTERFERENCE OF URINE PIGMENT  Wet prep, genital     Status: None   Collection Time: 01/21/18  1:10 PM  Result Value Ref Range   Yeast Wet Prep HPF POC NONE SEEN NONE SEEN   Trich, Wet Prep NONE SEEN NONE SEEN   Clue Cells Wet Prep  HPF POC NONE SEEN NONE SEEN   WBC, Wet Prep HPF POC NONE SEEN NONE SEEN   Sperm NONE SEEN   CBC with Differential/Platelet     Status: Abnormal   Collection Time: 01/21/18  1:39 PM  Result Value Ref Range   WBC 11.1 (H) 4.0 - 10.5 K/uL   RBC 3.83 (L) 3.87 - 5.11 MIL/uL   Hemoglobin 11.0 (L) 12.0 - 15.0 g/dL   HCT 60.4 (L) 54.0 - 98.1 %   MCV 86.9 80.0 - 100.0 fL   MCH 28.7 26.0 - 34.0 pg   MCHC 33.0 30.0 - 36.0 g/dL   RDW 19.1 47.8 - 29.5 %   Platelets 269 150 - 400 K/uL   nRBC 0.0 0.0 - 0.2 %   Neutrophils Relative % 70 %   Neutro Abs 7.9 (H) 1.7 - 7.7 K/uL   Lymphocytes Relative 20 %   Lymphs Abs 2.2 0.7 - 4.0 K/uL   Monocytes Relative 6 %   Monocytes Absolute 0.6 0.1 - 1.0 K/uL   Eosinophils Relative 3 %   Eosinophils Absolute 0.3 0.0 - 0.5 K/uL   Basophils Relative 0 %   Basophils Absolute 0.1 0.0 - 0.1 K/uL   Immature Granulocytes 1 %   Abs Immature Granulocytes 0.06 0.00 - 0.07 K/uL  Type and screen     Status: None (Preliminary result)   Collection Time: 01/21/18  1:39 PM  Result Value Ref Range   ABO/RH(D) PENDING    Antibody Screen PENDING    Sample Expiration      01/24/2018 Performed at [facility], [address], [place], [place] [address]     Pertinent Results:  Prenatal Labs: Blood type/Rh  O Pos  Antibody screen neg   Rubella Immune  Varicella Immune  RPR NR  HBsAg Neg  HIV NR  GC neg  Chlamydia neg  Genetic screening negative  1 hour GTT 117   FHT: 145bpm, mod variability, no decels TOCO: none noted SVE: deferred.   Bedside US done: fetus in breech position, active FM, amniotic fluid noted.    No results found.  Assessment:  Jill Daniel is a 27 y.o. G13P0020 female at [redacted]w[redacted]d with vaginal bleeding, 3rd trimester.   Plan:  1. Admit to Labor & Delivery; consents reviewed and obtained  2. Fetal Well being  - Fetal Tracing: Cat I appropriate for GA - Group B Streptococcus ppx indicated: n/a - Presentation: breech confirmed by [place]  3. Routine OB: - Prenatal labs reviewed, as above  4. Monitoring of VB -  Contractions: None, external toco in place -  Plan for continuous fetal monitoring  -  Labs per d/w Dr Elesa Massed, CBC, K-B, DIC panel and type and screen.  -  Rh O pos -  NPO, IVF -  Betamethasone for fetal lung maturity -  Formal US ordered for AFI, placenta location and growth.    McVey, REBECCA A, CNM 01/21/18 2:21 PM

## 2018-01-21 NOTE — Progress Notes (Addendum)
ANTEPARTUM PROGRESS NOTE  Jill Daniel is a 27 y.o. G3P0020 at [redacted]w[redacted]d with [facility] Estimated Date of Delivery: 04/10/18 who is admitted for vaginal bleeding and leaking fluid.   Length of Stay:  0 Days. Admitted 01/21/2018  Subjective: Patient reports good fetal movement.  She reports no uterine contractions.   Vitals:  BP 122/74   Pulse 72   Temp 98.8 F (37.1 C) (Oral)   Resp 16   LMP 07/04/2017 (Approximate)   Physical Examination: CONSTITUTIONAL: Well-developed, well-nourished female in no acute distress.  HENT:  Normocephalic, atraumatic, External right and left ear normal. Oropharynx is clear and moist EYES: Conjunctivae and EOM are normal. Pupils are equal, round, and reactive to light. No scleral icterus.  NECK: Normal range of motion, supple, no masses SKIN: Skin is warm and dry. No rash noted. Not diaphoretic. No erythema. No pallor. NEUROLGIC: Alert and oriented to person, place, and time. Normal reflexes, muscle tone coordination. No cranial nerve deficit noted. PSYCHIATRIC: Normal mood and affect. Normal behavior. Normal judgment and thought content. CARDIOVASCULAR: Normal heart rate noted, regular rhythm RESPIRATORY: Effort and breath sounds normal, no problems with respiration noted, lungs clear to ascultation bilaterally MUSCULOSKELETAL: Normal range of motion. No edema and no tenderness. 2+ distal pulses. ABDOMEN: Soft, nontender, nondistended, gravid. SSE repeated, Bloody fluid, pooling noted.   CERVIX: Dilation: 1 Exam by:: McVey  Fetal monitoring: FHR: Uterine activity:   Results for orders placed or performed during the hospital encounter of 01/21/18 (from the past 48 hour(s))  Urinalysis, Routine w reflex microscopic     Status: Abnormal   Collection Time: 01/21/18  1:10 PM  Result Value Ref Range   Color, Urine RED (A) YELLOW    Comment: BIOCHEMICALS MAY BE AFFECTED BY COLOR CORRECTED ON 10/20 AT 1422: PREVIOUSLY REPORTED AS RED    APPearance HAZY  (A) CLEAR   Specific Gravity, Urine 1.020 1.005 - 1.030   pH  5.0 - 8.0    TEST NOT REPORTED DUE TO COLOR INTERFERENCE OF URINE PIGMENT   Glucose, UA (A) NEGATIVE mg/dL    TEST NOT REPORTED DUE TO COLOR INTERFERENCE OF URINE PIGMENT   Hgb urine dipstick (A) NEGATIVE    TEST NOT REPORTED DUE TO COLOR INTERFERENCE OF URINE PIGMENT   Bilirubin Urine (A) NEGATIVE    TEST NOT REPORTED DUE TO COLOR INTERFERENCE OF URINE PIGMENT   Ketones, ur (A) NEGATIVE mg/dL    TEST NOT REPORTED DUE TO COLOR INTERFERENCE OF URINE PIGMENT   Protein, ur (A) NEGATIVE mg/dL    TEST NOT REPORTED DUE TO COLOR INTERFERENCE OF URINE PIGMENT   Nitrite (A) NEGATIVE    TEST NOT REPORTED DUE TO COLOR INTERFERENCE OF URINE PIGMENT   Leukocytes, UA (A) NEGATIVE    TEST NOT REPORTED DUE TO COLOR INTERFERENCE OF URINE PIGMENT    Comment: Performed at [facility], [address] [place], [place] [address]  Wet prep, genital     Status: None   Collection Time: 01/21/18  1:10 PM  Result Value Ref Range   Yeast Wet Prep HPF POC NONE SEEN NONE SEEN   Trich, Wet Prep NONE SEEN NONE SEEN   Clue Cells Wet Prep HPF POC NONE SEEN NONE SEEN   WBC, Wet Prep HPF POC NONE SEEN NONE SEEN   Sperm NONE SEEN     Comment: Performed at [facility], [address] [place], [place] [address]  Urinalysis, Microscopic (reflex)     Status: None  Collection Time: 01/21/18  1:10 PM  Result Value Ref Range   RBC / HPF >50 0 - 5 RBC/hpf   WBC, UA 0-5 0 - 5 WBC/hpf   Bacteria, UA NONE SEEN NONE SEEN   Squamous Epithelial / LPF NONE SEEN 0 - 5    Comment: Performed at [facility], [address] [place], [place] [address]  CBC with Differential/Platelet     Status: Abnormal   Collection Time: 01/21/18  1:39 PM  Result Value Ref Range   WBC 11.1 (H) 4.0 - 10.5 K/uL   RBC 3.83 (L) 3.87 - 5.11 MIL/uL   Hemoglobin 11.0 (L) 12.0 - 15.0 g/dL   HCT 60.4 (L) 54.0 - 98.1 %   MCV 86.9 80.0 - 100.0 fL   MCH 28.7  26.0 - 34.0 pg   MCHC 33.0 30.0 - 36.0 g/dL   RDW 19.1 47.8 - 29.5 %   Platelets 269 150 - 400 K/uL   nRBC 0.0 0.0 - 0.2 %   Neutrophils Relative % 70 %   Neutro Abs 7.9 (H) 1.7 - 7.7 K/uL   Lymphocytes Relative 20 %   Lymphs Abs 2.2 0.7 - 4.0 K/uL   Monocytes Relative 6 %   Monocytes Absolute 0.6 0.1 - 1.0 K/uL   Eosinophils Relative 3 %   Eosinophils Absolute 0.3 0.0 - 0.5 K/uL   Basophils Relative 0 %   Basophils Absolute 0.1 0.0 - 0.1 K/uL   Immature Granulocytes 1 %   Abs Immature Granulocytes 0.06 0.00 - 0.07 K/uL    Comment: Performed at [facility], [address] [place], [place] [address]  Type and screen     Status: None   Collection Time: 01/21/18  1:39 PM  Result Value Ref Range   ABO/RH(D) O POS    Antibody Screen NEG    Sample Expiration      01/24/2018 Performed at [facility], [address] [place], [place] [address]   Fibrin derivatives D-Dimer [facility] only)     Status: Abnormal   Collection Time: 01/21/18  1:39 PM  Result Value Ref Range   Fibrin derivatives D-dimer ([facility]) 686.33 (H) 0.00 - 499.00 ng/mL (FEU)    Comment: (NOTE) <> Exclusion of Venous Thromboembolism (VTE) - OUTPATIENT ONLY   (Emergency Department or [place])   0-499 ng/ml (FEU): With a low to intermediate pretest probability                      for VTE this test result excludes the diagnosis                      of VTE.   >499 ng/ml (FEU) : VTE not excluded; additional work up for VTE is                      required. <> Testing on Inpatients and Evaluation of Disseminated Intravascular   Coagulation (DIC) Reference Range:   0-499 ng/ml (FEU) Performed at [facility], [address] [place], [place] [address]   Fibrinogen     Status: None   Collection Time: 01/21/18  1:39 PM  Result Value Ref Range   Fibrinogen 438 210 - 475 mg/dL    Comment: Performed at [facility], [address], [place], [place] [address]  Platelet count     Status:  None   Collection Time: 01/21/18  1:39 PM  Result Value Ref Range  Platelets 279 150 - 400 K/uL    Comment: Performed at [facility], [address] [place], [place] [address]  Protime-INR     Status: None   Collection Time: 01/21/18  1:39 PM  Result Value Ref Range   Prothrombin Time 12.1 11.4 - 15.2 seconds   INR 0.90     Comment: Performed at [facility], [address] [place], [place] [address]  APTT     Status: None   Collection Time: 01/21/18  1:39 PM  Result Value Ref Range   aPTT 28 24 - 36 seconds    Comment: Performed at [facility], [address] [place], [place] [address]    No results found.  Current scheduled medications . [START ON 01/23/2018] amoxicillin  500 mg Oral Q8H  . [START ON 01/23/2018] azithromycin  500 mg Oral Daily  . betamethasone acetate-betamethasone sodium phosphate  12 mg Intramuscular Q24 Hr x 2  . magnesium  6 g Intravenous Once    I have reviewed the patient's current medications.  ASSESSMENT: Patient Active Problem List   Diagnosis Date Noted  . Vaginal bleeding in pregnancy, third trimester 01/21/2018  . Preterm premature rupture of membranes (PPROM) with unknown onset of labor 01/21/2018  . Bipolar I disorder (HCC) 11/16/2015  . Panic attacks 11/16/2015    PLAN: D/W Dr Elesa Massed, exam c/w PPROM;  - Lab unable to run ROMPlus due to blood contamination. Will do a slide for fern.  - Magnesium sulfate 6g load then 2gm/hr for neuroprotection - Amp 2gm IVPB and Azithro500mg  IVPB now.  Continue routine antenatal care.   Randa Ngo, CNM 01/21/2018  3:04 PM   TC to [facility] at 1615;  presented pt info.  - GBS swab requested - Pt to be transferred to tertiary care, pts family requested for transfer to [facility] if possible.   Randa Ngo, CNM. 01/21/2018 4:18 PM  [facility] accepted care, but NICU full per charge nurse.  [facility] contacted, Dr Vira Browns accepted transfer of  care.  [facility] called and report given, ETA approx 1 hr.   Randa Ngo, CNM 01/21/2018 5:34 PM

## 2018-01-21 NOTE — Discharge Summary (Signed)
Patient ID: Jill Daniel MRN: [id number] DOB/AGE: Apr 02, 1991 27 y.o.  Admit date: 01/21/2018 Discharge date: 01/21/2018  Admission Diagnoses: vaginal bleeding in 3rd trimester  Discharge Diagnoses: PPROM, vaginal bleeding, gonorrhea in pregnancy  Prenatal Procedures: ultrasound  Consults:  Maternal Fetal Medicine  Significant Diagnostic Studies:  Results for orders placed or performed during the hospital encounter of 01/21/18 (from the past 168 hour(s))  Chlamydia/NGC rt PCR ([facility] only)   Collection Time: 01/21/18  1:10 PM  Result Value Ref Range   Specimen source GC/Chlam ENDOCERVICAL    Chlamydia Tr NOT DETECTED NOT DETECTED   N gonorrhoeae DETECTED (A) NOT DETECTED  Wet prep, genital   Collection Time: 01/21/18  1:10 PM  Result Value Ref Range   Yeast Wet Prep HPF POC NONE SEEN NONE SEEN   Trich, Wet Prep NONE SEEN NONE SEEN   Clue Cells Wet Prep HPF POC NONE SEEN NONE SEEN   WBC, Wet Prep HPF POC NONE SEEN NONE SEEN   Sperm NONE SEEN   Urinalysis, Routine w reflex microscopic   Collection Time: 01/21/18  1:10 PM  Result Value Ref Range   Color, Urine RED (A) YELLOW   APPearance HAZY (A) CLEAR   Specific Gravity, Urine 1.020 1.005 - 1.030   pH  5.0 - 8.0    TEST NOT REPORTED DUE TO COLOR INTERFERENCE OF URINE PIGMENT   Glucose, UA (A) NEGATIVE mg/dL    TEST NOT REPORTED DUE TO COLOR INTERFERENCE OF URINE PIGMENT   Hgb urine dipstick (A) NEGATIVE    TEST NOT REPORTED DUE TO COLOR INTERFERENCE OF URINE PIGMENT   Bilirubin Urine (A) NEGATIVE    TEST NOT REPORTED DUE TO COLOR INTERFERENCE OF URINE PIGMENT   Ketones, ur (A) NEGATIVE mg/dL    TEST NOT REPORTED DUE TO COLOR INTERFERENCE OF URINE PIGMENT   Protein, ur (A) NEGATIVE mg/dL    TEST NOT REPORTED DUE TO COLOR INTERFERENCE OF URINE PIGMENT   Nitrite (A) NEGATIVE    TEST NOT REPORTED DUE TO COLOR INTERFERENCE OF URINE PIGMENT   Leukocytes, UA (A) NEGATIVE    TEST NOT REPORTED DUE TO COLOR INTERFERENCE OF  URINE PIGMENT  Urinalysis, Microscopic (reflex)   Collection Time: 01/21/18  1:10 PM  Result Value Ref Range   RBC / HPF >50 0 - 5 RBC/hpf   WBC, UA 0-5 0 - 5 WBC/hpf   Bacteria, UA NONE SEEN NONE SEEN   Squamous Epithelial / LPF NONE SEEN 0 - 5  Urine Drug Screen, Qualitative ([facility] only)   Collection Time: 01/21/18  1:13 PM  Result Value Ref Range   Tricyclic, Ur Screen NONE DETECTED NONE DETECTED   Amphetamines, Ur Screen NONE DETECTED NONE DETECTED   MDMA (Ecstasy)Ur Screen NONE DETECTED NONE DETECTED   Cocaine Metabolite,Ur [place] NONE DETECTED NONE DETECTED   Opiate, Ur Screen NONE DETECTED NONE DETECTED   Phencyclidine (PCP) Ur S NONE DETECTED NONE DETECTED   Cannabinoid 50 Ng, Ur [place] POSITIVE (A) NONE DETECTED   Barbiturates, Ur Screen NONE DETECTED NONE DETECTED   Benzodiazepine, Ur Scrn NONE DETECTED NONE DETECTED   Methadone Scn, Ur NONE DETECTED NONE DETECTED  Urinalysis, Routine w reflex microscopic   Collection Time: 01/21/18  1:13 PM  Result Value Ref Range   Color, Urine YELLOW YELLOW   APPearance CLEAR CLEAR   Specific Gravity, Urine 1.020 1.005 - 1.030   pH 8.5 (H) 5.0 - 8.0   Glucose, UA NEGATIVE NEGATIVE mg/dL   Hgb urine dipstick  NEGATIVE NEGATIVE   Bilirubin Urine NEGATIVE NEGATIVE   Ketones, ur NEGATIVE NEGATIVE mg/dL   Protein, ur NEGATIVE NEGATIVE mg/dL   Nitrite NEGATIVE NEGATIVE   Leukocytes, UA NEGATIVE NEGATIVE  CBC with Differential/Platelet   Collection Time: 01/21/18  1:39 PM  Result Value Ref Range   WBC 11.1 (H) 4.0 - 10.5 K/uL   RBC 3.83 (L) 3.87 - 5.11 MIL/uL   Hemoglobin 11.0 (L) 12.0 - 15.0 g/dL   HCT 81.1 (L) 91.4 - 78.2 %   MCV 86.9 80.0 - 100.0 fL   MCH 28.7 26.0 - 34.0 pg   MCHC 33.0 30.0 - 36.0 g/dL   RDW 95.6 21.3 - 08.6 %   Platelets 269 150 - 400 K/uL   nRBC 0.0 0.0 - 0.2 %   Neutrophils Relative % 70 %   Neutro Abs 7.9 (H) 1.7 - 7.7 K/uL   Lymphocytes Relative 20 %   Lymphs Abs 2.2 0.7 - 4.0 K/uL   Monocytes Relative 6 %    Monocytes Absolute 0.6 0.1 - 1.0 K/uL   Eosinophils Relative 3 %   Eosinophils Absolute 0.3 0.0 - 0.5 K/uL   Basophils Relative 0 %   Basophils Absolute 0.1 0.0 - 0.1 K/uL   Immature Granulocytes 1 %   Abs Immature Granulocytes 0.06 0.00 - 0.07 K/uL  Kleihauer-Betke stain   Collection Time: 01/21/18  1:39 PM  Result Value Ref Range   Fetal Cells % 0 %   Quantitation Fetal Hemoglobin 0.000 mL   # Vials RhIg NOT INDICATED   Fibrin derivatives D-Dimer ([facility] only)   Collection Time: 01/21/18  1:39 PM  Result Value Ref Range   Fibrin derivatives D-dimer ([facility]) 686.33 (H) 0.00 - 499.00 ng/mL (FEU)  Fibrinogen   Collection Time: 01/21/18  1:39 PM  Result Value Ref Range   Fibrinogen 438 210 - 475 mg/dL  Platelet count   Collection Time: 01/21/18  1:39 PM  Result Value Ref Range   Platelets 279 150 - 400 K/uL  Protime-INR   Collection Time: 01/21/18  1:39 PM  Result Value Ref Range   Prothrombin Time 12.1 11.4 - 15.2 seconds   INR 0.90   APTT   Collection Time: 01/21/18  1:39 PM  Result Value Ref Range   aPTT 28 24 - 36 seconds  Type and screen   Collection Time: 01/21/18  1:39 PM  Result Value Ref Range   ABO/RH(D) O POS    Antibody Screen NEG    Sample Expiration      01/24/2018 Performed at [facility], [address] [place], [place] [address]   ROM Plus [facility] only)   Collection Time: 01/21/18  4:42 PM  Result Value Ref Range   Rom Plus POSITIVE     Treatments: antibiotics: Ampicillin and Azithromycin IVPB, Rocephin 250mg  IM, steroids: betamethasone 12mg  IM, and magnesium sulfate 6gm load, then 2gm/hr.   Hospital Course:  This is a 27 y.o. G3P0020 with IUP at [redacted]w[redacted]d admitted for vaginal bleeding. SSE done and copious amt of bloody fluid with clots noted. Wet prep, GC-CT, GBS, and ROM Plus sent. GC positive, tx'd with Rocephin 250mg  IM. Formal US obtained, AFI 20.1cm and fetus breech position. CBC and Coag panel obtained and WNL.  She was initially started on  magnesium sulfate for neuroprotection and also received betamethasone x 1 doses. [facility] was consulted and they recommended transfer of care to tertiary care. Transfer of care was accepted by [facility], and transport arranged via [facility].  Discharge Physical Exam:  BP 113/72   Pulse 87   Temp 98.8 F (37.1 C) (Oral)   Resp 16   LMP 07/04/2017 (Approximate)   General: NAD but anxious CV: RRR Pulm: CTABL, nl effort ABD: s/nd/nt, gravid DVT Evaluation: LE non-ttp, no evidence of DVT on exam.  SVE: 1cm/thick/soft/posterior, fetal presenting part not felt.  FHT: 135bpm, mod variability, + accels, occasional mild variable with spont recovery.  TOCO: uterine irritability   Discharge Condition: Stable  Disposition: transfer to [facility].    Allergies as of 01/21/2018      Reactions   Decadron [dexamethasone] Other (See Comments)   "It interacts bad with my bipolar and makes me go crazy" = Psychosis   Grapefruit Extract Other (See Comments)   Cannot take with latuda   Tape Rash   NO PLASTIC TAPE, PLEASE!! (Paper tape preferred)      Medication List    You have not been prescribed any medications.      Signed:  Randa Ngo, CNM 01/21/2018  5:47 PM

## 2018-01-21 NOTE — Progress Notes (Signed)
Report called to Asher Muir, Charity fundraiser. Elaina Hoops

## 2018-01-22 LAB — URINE CULTURE: CULTURE: NO GROWTH

## 2018-01-23 LAB — RPR: RPR Ser Ql: NONREACTIVE

## 2018-01-31 ENCOUNTER — Other Ambulatory Visit: Payer: Self-pay

## 2018-01-31 ENCOUNTER — Encounter: Payer: Self-pay | Admitting: Obstetrics and Gynecology

## 2018-01-31 ENCOUNTER — Observation Stay
Admission: EM | Admit: 2018-01-31 | Discharge: 2018-02-01 | Disposition: A | Discharge status: Other Acute Inpatient Hospital | Payer: Medicaid Other | Attending: Obstetrics and Gynecology | Admitting: Obstetrics and Gynecology

## 2018-01-31 ENCOUNTER — Inpatient Hospital Stay: Payer: Medicaid Other

## 2018-01-31 DIAGNOSIS — F419 Anxiety disorder, unspecified: Secondary | ICD-10-CM | POA: Insufficient documentation

## 2018-01-31 DIAGNOSIS — O99343 Other mental disorders complicating pregnancy, third trimester: Secondary | ICD-10-CM | POA: Insufficient documentation

## 2018-01-31 DIAGNOSIS — M419 Scoliosis, unspecified: Secondary | ICD-10-CM | POA: Insufficient documentation

## 2018-01-31 DIAGNOSIS — F319 Bipolar disorder, unspecified: Secondary | ICD-10-CM | POA: Insufficient documentation

## 2018-01-31 DIAGNOSIS — Z87891 Personal history of nicotine dependence: Secondary | ICD-10-CM | POA: Insufficient documentation

## 2018-01-31 DIAGNOSIS — R58 Hemorrhage, not elsewhere classified: Secondary | ICD-10-CM

## 2018-01-31 DIAGNOSIS — O26893 Other specified pregnancy related conditions, third trimester: Secondary | ICD-10-CM | POA: Insufficient documentation

## 2018-01-31 DIAGNOSIS — N939 Abnormal uterine and vaginal bleeding, unspecified: Secondary | ICD-10-CM | POA: Diagnosis present

## 2018-01-31 DIAGNOSIS — O468X3 Other antepartum hemorrhage, third trimester: Secondary | ICD-10-CM | POA: Diagnosis not present

## 2018-01-31 DIAGNOSIS — Z3A30 30 weeks gestation of pregnancy: Secondary | ICD-10-CM | POA: Insufficient documentation

## 2018-01-31 DIAGNOSIS — Z818 Family history of other mental and behavioral disorders: Secondary | ICD-10-CM | POA: Insufficient documentation

## 2018-01-31 MED ORDER — LACTATED RINGERS IV SOLN
INTRAVENOUS | Status: DC
  Administered 2018-01-31: 23:00:00 via INTRAVENOUS

## 2018-01-31 MED ORDER — ACETAMINOPHEN 325 MG PO TABS
650.0000 mg | ORAL_TABLET | ORAL | Status: DC | PRN
  Administered 2018-02-01: 650 mg via ORAL
  Filled 2018-01-31: qty 2

## 2018-01-31 NOTE — OB Triage Note (Signed)
Pt presents with c/o vaginal bleeding. Started "about an hour ago." Pt voided an noticed bright red blood in toilet with "5 quarter sized clots" per pt's mother. Pt denies contractions or leaking of fluid. Pt does state she has experienced "no fetal movement" since leaving house for hospital. Toco and EFM applied and explained. All questions answered.
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
## 2018-02-01 DIAGNOSIS — O468X3 Other antepartum hemorrhage, third trimester: Secondary | ICD-10-CM | POA: Diagnosis not present

## 2018-02-01 NOTE — Progress Notes (Signed)
Patient ID: Jill Daniel, female   DOB: 20-Aug-1990, 27 y.o.   MRN: [id number] Patient ID: Jill Daniel MRN: [id number] DOB/AGE: 02/01/1991 27 y.o.  Admit date: 01/31/2018 Discharge date: 02/01/2018  Admission Diagnoses:IUP at 30 1/7 weeks with vag bleeding, hx of partial abruption, uterine septum, previa which resolved, suspected PPROM, Recent tx for [facility]  Discharge Diagnoses: Th PTL due to vag bleeding, Partial abruption, At risk for Pre-term delivery,   Prenatal Procedures: NST, BMZ x 2 doses in past, Antibiotics, pelvic rest  Consults: Neonatology, Maternal Fetal Medicine  Significant Diagnostic Studies:  Fetal MRI: of brain indicating normal pathology, EX:[id number] abruption, Previa resolved.  Treatments: Ceftriaxone 250 mg IV x 1 for +GC, Pt has had 3 x this pregnancy  Hospital Course:  This is a 27 y.o. G3P0020 with IUP at [redacted]w[redacted]d admitted for third trimester bleeding vaginally, hx of partial abruption, tx in the past with MagSO4 for tocolysis and neuroprotection, Antibiotics, BMZ x 2, admission to [facility] on 01/21/18.  Her tocolysis was transitioned to Procardia. She was seen by Neonatology during her stay.  She was observed, fetal heart rate monitoring remained reassuring, and she had no signs/symptoms of progressing preterm labor or other maternal-fetal concerns Pt was discharged home and had stopped bleeding till this pm when she resumed the bright red vaginal bleeding with clots. After discussion with Dr Dalbert Garnet, pt is at risk for pre-term birth &, noted to have a cervical exam of 1 cm when last checked prior to leaving [facility]. Cx was not checked as bleeding is mod and no reason to stir up the bleeding.. Transport arrived at 2:30am for pickup of pt to go to [facility]. Marland Kitchen   Discharge Physical Exam: BP 108/73   Pulse 66   Temp 98.4 F (36.9 C) (Oral)   Resp 16   Ht 5' 3.5" (1.613 m)   Wt 83.9 kg   LMP 07/04/2017 (Approximate)   SpO2 99%   BMI 32.26 kg/m   General:  NAD CV: RRR Pulm: CTABL, nl effort ABD: s/nd/nt, gravid DVT Evaluation: LE non-ttp, no evidence of DVT on exam. SVE: Not recently checked, previously 1 cm FHT: 145, +accels, no decels, Cat 1 strip  TOCO:no UC's noted  Discharge Condition: Stable  Disposition: Transfer to [facility] indicated for higher level of care  _____________________________________________ Myrtie Cruise, MSN, CNM, FNP Certified Nurse Midwife [facility]

## 2018-02-01 NOTE — Progress Notes (Signed)
[facility] arrived for pt transport. Report given.

## 2018-02-12 ENCOUNTER — Other Ambulatory Visit: Payer: Self-pay

## 2018-02-12 ENCOUNTER — Inpatient Hospital Stay
Admission: EM | Admit: 2018-02-12 | Discharge: 2018-02-12 | Disposition: A | Discharge status: Home / Self Care | Payer: Medicaid Other | Attending: Obstetrics and Gynecology | Admitting: Obstetrics and Gynecology

## 2018-02-12 DIAGNOSIS — O4593 Premature separation of placenta, unspecified, third trimester: Secondary | ICD-10-CM | POA: Insufficient documentation

## 2018-02-12 DIAGNOSIS — F319 Bipolar disorder, unspecified: Secondary | ICD-10-CM | POA: Diagnosis not present

## 2018-02-12 DIAGNOSIS — F410 Panic disorder [episodic paroxysmal anxiety] without agoraphobia: Secondary | ICD-10-CM | POA: Insufficient documentation

## 2018-02-12 DIAGNOSIS — Z3A31 31 weeks gestation of pregnancy: Secondary | ICD-10-CM | POA: Diagnosis not present

## 2018-02-12 DIAGNOSIS — O99343 Other mental disorders complicating pregnancy, third trimester: Secondary | ICD-10-CM | POA: Diagnosis not present

## 2018-02-12 LAB — URINALYSIS, COMPLETE (UACMP) WITH MICROSCOPIC
Bilirubin Urine: NEGATIVE
GLUCOSE, UA: NEGATIVE mg/dL
Hgb urine dipstick: NEGATIVE
Ketones, ur: NEGATIVE mg/dL
Leukocytes, UA: NEGATIVE
Nitrite: NEGATIVE
PH: 6 (ref 5.0–8.0)
Protein, ur: NEGATIVE mg/dL
Specific Gravity, Urine: 1.006 (ref 1.005–1.030)

## 2018-02-12 LAB — CHLAMYDIA/NGC RT PCR (ARMC ONLY)
Chlamydia Tr: NOT DETECTED
N GONORRHOEAE: NOT DETECTED

## 2018-02-12 LAB — COMPREHENSIVE METABOLIC PANEL
ALK PHOS: 89 U/L (ref 38–126)
ALT: 10 U/L (ref 0–44)
ANION GAP: 11 (ref 5–15)
AST: 16 U/L (ref 15–41)
Albumin: 3 g/dL — ABNORMAL LOW (ref 3.5–5.0)
BUN: 8 mg/dL (ref 6–20)
CALCIUM: 8.4 mg/dL — AB (ref 8.9–10.3)
CHLORIDE: 101 mmol/L (ref 98–111)
CO2: 22 mmol/L (ref 22–32)
Creatinine, Ser: 0.44 mg/dL (ref 0.44–1.00)
GFR calc non Af Amer: >60 mL/min (ref >60)
Glucose, Bld: 77 mg/dL (ref 70–99)
POTASSIUM: 4.2 mmol/L (ref 3.5–5.1)
SODIUM: 134 mmol/L — AB (ref 135–145)
Total Bilirubin: 0.4 mg/dL (ref 0.3–1.2)
Total Protein: 6.6 g/dL (ref 6.5–8.1)

## 2018-02-12 LAB — CBC
HCT: 35.2 % — ABNORMAL LOW (ref 36.0–46.0)
HEMOGLOBIN: 11.8 g/dL — AB (ref 12.0–15.0)
MCH: 29.6 pg (ref 26.0–34.0)
MCHC: 33.5 g/dL (ref 30.0–36.0)
MCV: 88.2 fL (ref 80.0–100.0)
NRBC: 0 % (ref 0.0–0.2)
PLATELETS: 268 10*3/uL (ref 150–400)
RBC: 3.99 MIL/uL (ref 3.87–5.11)
RDW: 13 % (ref 11.5–15.5)
WBC: 13 10*3/uL — ABNORMAL HIGH (ref 4.0–10.5)

## 2018-02-12 LAB — PROTEIN / CREATININE RATIO, URINE
Creatinine, Urine: 47 mg/dL
Total Protein, Urine: <6 mg/dL

## 2018-02-12 MED ORDER — LABETALOL HCL 5 MG/ML IV SOLN: 20.0000 mg | INTRAVENOUS | Status: DC | PRN

## 2018-02-12 MED ORDER — LABETALOL HCL 5 MG/ML IV SOLN: 80.0000 mg | INTRAVENOUS | Status: DC | PRN

## 2018-02-12 MED ORDER — LABETALOL HCL 5 MG/ML IV SOLN: 40.0000 mg | INTRAVENOUS | Status: DC | PRN

## 2018-02-12 MED ORDER — HYDRALAZINE HCL 20 MG/ML IJ SOLN: 10.0000 mg | INTRAMUSCULAR | Status: DC | PRN

## 2018-02-12 NOTE — Discharge Summary (Addendum)
Obstetric Discharge Summary Reason for Admission: Evaluation for preeclampsia d/t elevated b/p in office and panic attack Prenatal Procedures: NST, [place]  RUE:[id number] A Wanninger is a 27 y.o. female G3P0020 with LMP of 07/10/17 & EDD of 04/16/17 wiith hx of having vag bleedin.  Pt was admitted on 01/21/18 at [facility] for Uterine septum, PPROM suspected, Partial abruption and breech presentation, Pt also just diagnosed with +GC and tx with Rocephin 250 mg IV. Pt was given Antibiotics at [facility], BMZ x 2, MagSO4, and after 3 days of no vag bleeding was discharged home. Since that time, has been on limited activity and pelvic rest.  PPROM suspected. Patient presents to [facility] triage for evaluation of elevated b/p in office. Denies HA, black spots, blurred vision, RUQ pain. Reports irregular period like cramping. Denies vaginal leaking. Endorses good fetal movement.   Past Medical History:  Diagnosis Date  . Anxiety   . Bipolar 1 disorder (HCC)   . Seizures (HCC)    Past Surgical History:  Procedure Laterality Date  . ADENOIDECTOMY    . tailbone    . TONSILLECTOMY    . WISDOM TOOTH EXTRACTION     Social History   Socioeconomic History  . Marital status: Single    Spouse name: Not on file  . Number of children: Not on file  . Years of education: Not on file  . Highest education level: Not on file  Occupational History  . Not on file  Social Needs  . Financial resource strain: Not on file  . Food insecurity:    Worry: Not on file    Inability: Not on file  . Transportation needs:    Medical: Not on file    Non-medical: Not on file  Tobacco Use  . Smoking status: Former Smoker    Packs/day: 0.00  . Smokeless tobacco: Never Used  Substance and Sexual Activity  . Alcohol use: No    Comment: Socially  . Drug use: No  . Sexual activity: Never    Birth control/protection: None  Lifestyle  . Physical activity:    Days per week: Not on file    Minutes per session: Not on file  . Stress: Not on file   Relationships  . Social connections:    Talks on phone: Not on file    Gets together: Not on file    Attends religious service: Not on file    Active member of club or organization: Not on file    Attends meetings of clubs or organizations: Not on file    Relationship status: Not on file  . Intimate partner violence:    Fear of current or ex partner: Not on file    Emotionally abused: Not on file    Physically abused: Not on file    Forced sexual activity: Not on file  Other Topics Concern  . Not on file  Social History Narrative   Lives with grandma   Caffeine use: tea/coffee 2x/day   NST: Baseline: 135 bpm Variability: moderate  Accels: 15x15 present Decels: none Category 1 tracing  NST reactive   Labs: Hemoglobin  Date Value Ref Range Status  02/12/2018 11.8 (L) 12.0 - 15.0 g/dL Final   HCT  Date Value Ref Range Status  02/12/2018 35.2 (L) 36.0 - 46.0 % Final    Physical Exam:  General: alert and cooperative, A,A, & O x 3 Heart: S1:S2, no M/R/G Lungs: CTAB, no W/R/R [id number] Extremities: warm, dry, NT  No vag bleeding noted.  NO LOF noted. +FM Discharge Diagnoses:  1. IUP at 31 6/[redacted] weeks gestation 2. Chronic abruption 3. Bipolar d/o I 4. Panic attacks  5. Reactive NST   Discharge Information: Date: 02/12/2018 Activity: unrestricted Diet: routine Medications: Buspirone, Iron, PNV, Protonix  Condition: stable Instructions: Follow up with [facility] as scheduled for care. Notify provider for increased vaginal bleeding, regular contractions, vaginal leaking, or decreased fetal movement.  CJones, CNM will schedule f/u US this Thursday for AFI if [facility] cannot complete will do at [facility].  Discharge to: home   Currently seeing both [facility] and [facility], plans to deliver at [facility]. Advised pt that she needs to choose practice for delivery. Pt has chosen to attend appts at [facility] and fu with them at [facility].  Next appointment with [facility] is on Monday,  02/18/18.    Sharee Pimple 02/12/2018, 5:20 PM

## 2018-02-12 NOTE — Discharge Instructions (Signed)
Living With Anxiety °After being diagnosed with an anxiety disorder, you may be relieved to know why you have felt or behaved a certain way. It is natural to also feel overwhelmed about the treatment ahead and what it will mean for your life. With care and support, you can manage this condition and recover from it. °How to cope with anxiety °Dealing with stress °Stress is your body’s reaction to life changes and events, both good and bad. Stress can last just a few hours or it can be ongoing. Stress can play a major role in anxiety, so it is important to learn both how to cope with stress and how to think about it differently. °Talk with your health care provider or a counselor to learn more about stress reduction. He or she may suggest some stress reduction techniques, such as: °· Music therapy. This can include creating or listening to music that you enjoy and that inspires you. °· Mindfulness-based meditation. This involves being aware of your normal breaths, rather than trying to control your breathing. It can be done while sitting or walking. °· Centering prayer. This is a kind of meditation that involves focusing on a word, phrase, or sacred image that is meaningful to you and that brings you peace. °· Deep breathing. To do this, expand your stomach and inhale slowly through your nose. Hold your breath for 3-5 seconds. Then exhale slowly, allowing your stomach muscles to relax. °· Self-talk. This is a skill where you identify thought patterns that lead to anxiety reactions and correct those thoughts. °· Muscle relaxation. This involves tensing muscles then relaxing them. ° °Choose a stress reduction technique that fits your lifestyle and personality. Stress reduction techniques take time and practice. Set aside 5-15 minutes a day to do them. Therapists can offer training in these techniques. The training may be covered by some insurance plans. Other things you can do to manage stress include: °· Keeping a  stress diary. This can help you learn what triggers your stress and ways to control your response. °· Thinking about how you respond to certain situations. You may not be able to control everything, but you can control your reaction. °· Making time for activities that help you relax, and not feeling guilty about spending your time in this way. ° °Therapy combined with coping and stress-reduction skills provides the best chance for successful treatment. °Medicines °Medicines can help ease symptoms. Medicines for anxiety include: °· Anti-anxiety drugs. °· Antidepressants. °· Beta-blockers. ° °Medicines may be used as the main treatment for anxiety disorder, along with therapy, or if other treatments are not working. Medicines should be prescribed by a health care provider. °Relationships °Relationships can play a big part in helping you recover. Try to spend more time connecting with trusted friends and family members. Consider going to couples counseling, taking family education classes, or going to family therapy. Therapy can help you and others better understand the condition. °How to recognize changes in your condition °Everyone has a different response to treatment for anxiety. Recovery from anxiety happens when symptoms decrease and stop interfering with your daily activities at home or work. This may mean that you will start to: °· Have better concentration and focus. °· Sleep better. °· Be less irritable. °· Have more energy. °· Have improved memory. ° °It is important to recognize when your condition is getting worse. Contact your health care provider if your symptoms interfere with home or work and you do not feel like your condition   is improving. Where to find help and support: You can get help and support from these sources:  Self-help groups.  Online and [employer].  A trusted spiritual leader.  Couples counseling.  Family education classes.  Family therapy.  Follow these  instructions at home:  Eat a healthy diet that includes plenty of vegetables, fruits, whole grains, low-fat dairy products, and lean protein. Do not eat a lot of foods that are high in solid fats, added sugars, or salt.  Exercise. Most adults should do the following: ? Exercise for at least 150 minutes each week. The exercise should increase your heart rate and make you sweat (moderate-intensity exercise). ? Strengthening exercises at least twice a week.  Cut down on caffeine, tobacco, alcohol, and other potentially harmful substances.  Get the right amount and quality of sleep. Most adults need 7-9 hours of sleep each night.  Make choices that simplify your life.  Take over-the-counter and prescription medicines only as told by your health care provider.  Avoid caffeine, alcohol, and certain over-the-counter cold medicines. These may make you feel worse. Ask your pharmacist which medicines to avoid.  Keep all follow-up visits as told by your health care provider. This is important. Questions to ask your health care provider  Would I benefit from therapy?  How often should I follow up with a health care provider?  How long do I need to take medicine?  Are there any long-term side effects of my medicine?  Are there any alternatives to taking medicine? Contact a health care provider if:  You have a hard time staying focused or finishing daily tasks.  You spend many hours a day feeling worried about everyday life.  You become exhausted by worry.  You start to have headaches, feel tense, or have nausea.  You urinate more than normal.  You have diarrhea. Get help right away if:  You have a racing heart and shortness of breath.  You have thoughts of hurting yourself or others. If you ever feel like you may hurt yourself or others, or have thoughts about taking your own life, get help right away. You can go to your nearest emergency department or call:  Your local emergency  services (911 in the [place]  A suicide crisis helpline, such as the National Suicide Prevention Lifeline at 647-474-6153. This is open 24-hours a day.  Summary  Taking steps to deal with stress can help calm you.  Medicines cannot cure anxiety disorders, but they can help ease symptoms.  Family, friends, and partners can play a big part in helping you recover from an anxiety disorder. This information is not intended to replace advice given to you by your health care provider. Make sure you discuss any questions you have with your health care provider. Document Released: 03/15/2016 Document Revised: 03/15/2016 Document Reviewed: 03/15/2016 Elsevier Interactive Patient Education  2018 [employer].   Panic Attacks Panic attacks are sudden, short feelings of great fear or discomfort. You may have them for no reason when you are relaxed, when you are uneasy (anxious), or when you are sleeping. Follow these instructions at home:  Take all your medicines as told.  Check with your doctor before starting new medicines.  Keep all doctor visits. Contact a doctor if:  You are not able to take your medicines as told.  Your symptoms do not get better.  Your symptoms get worse. Get help right away if:  Your attacks seem different than your normal attacks.  You have  thoughts about hurting yourself or others.  You take panic attack medicine and you have a side effect. This information is not intended to replace advice given to you by your health care provider. Make sure you discuss any questions you have with your health care provider. Document Released: 04/23/2010 Document Revised: 08/27/2015 Document Reviewed: 11/02/2012 Elsevier Interactive Patient Education  2017 [employer] Inc[employer]   Perinatal Anxiety When a woman feels excessive tension or worry (anxiety) during pregnancy or during the first 12 months after she gives birth, she has a condition called perinatal anxiety. Anxiety can  interfere with work, school, relationships, and other everyday activities. If it is not managed properly, it can also cause problems in the mother and her baby.  If you are pregnant and you have symptoms of an anxiety disorder, it is important to talk with your health care provider. What are the causes? The exact cause of this condition is not known. Hormonal changes during and after pregnancy may play a role in causing perinatal anxiety. What increases the risk? You are more likely to develop this condition if:  You have a personal or family history of depression, anxiety, or mood disorders.  You experience a stressful life event during pregnancy, such as the death of a loved one.  You have a lot of regular life stress, such as being a single parent.  You have thyroid problems.  What are the signs or symptoms? Perinatal anxiety can be different for everyone. It may include:  Panic attacks (panic disorder). These are intense episodes of fear or discomfort that may also cause sweating, nausea, shortness of breath, or fear of dying. They usually last 5-15 minutes.  Reliving an upsetting (traumatic) event through distressing thoughts, dreams, or flashbacks (post-traumatic stress disorder, or PTSD).  Excessive worry about multiple problems (generalized anxiety disorder).  Fear and stress about leaving certain people or loved ones (separation anxiety).  Performing repetitive tasks (compulsions) to relieve stress or worry (obsessive compulsive disorder, or OCD).  Fear of certain objects or situations (phobias).  Excessive worrying, such as a constant feeling that something bad is going to happen.  Inability to relax.  Difficulty concentrating.  Sleep problems.  Frequent nightmares or disturbing thoughts.  How is this diagnosed? This condition is diagnosed based on a physical exam and mental evaluation. In some cases, your health care provider may use an anxiety screening tool.  These tools include a list of questions that can help a health care provider diagnose anxiety. Your health care provider may refer you to a mental health expert who specializes in anxiety. How is this treated? This condition may be treated with:  Medicines. Your health care provider will only give you medicines that have been proven safe for pregnancy and breastfeeding.  Talk therapy with a mental health professional to help change your patterns of thinking (cognitive behavioral therapy).  Mindfulness-based stress reduction.  Other relaxation therapies, such as deep breathing or guided muscle relaxation.  Support groups.  Follow these instructions at home: Lifestyle  Do not use any products that contain nicotine or tobacco, such as cigarettes and e-cigarettes. If you need help quitting, ask your health care provider.  Do not use alcohol when you are pregnant. After your baby is born, limit alcohol intake to no more than 1 drink a day. One drink equals 12 oz of beer, 5 oz of wine, or 1 oz of hard liquor.  Consider joining a support group for new mothers. Ask your health care provider for  recommendations.  Take good care of yourself. Make sure you: ? Get plenty of sleep. If you are having trouble sleeping, talk with your health care provider. ? Eat a healthy diet. This includes plenty of fruits and vegetables, whole grains, and lean proteins. ? Exercise regularly, as told by your health care provider. Ask your health care provider what exercises are safe for you. General instructions  Take over-the-counter and prescription medicines only as told by your health care provider.  Talk with your partner or family members about your feelings during pregnancy. Share any concerns or fears that you may have.  Ask for help with tasks or chores when you need it. Ask friends and family members to provide meals, watch your children, or help with cleaning.  Keep all follow-up visits as told by  your health care provider. This is important. Contact a health care provider if:  You (or people close to you) notice that you have any symptoms of anxiety or depression.  You have anxiety and your symptoms get worse.  You experience side effects from medicines, such as nausea or sleep problems. Get help right away if:  You feel like hurting yourself, your baby, or someone else. If you ever feel like you may hurt yourself or others, or have thoughts about taking your own life, get help right away. You can go to your nearest emergency department or call:  Your local emergency services (911 in the [place]  A suicide crisis helpline, such as the National Suicide Prevention Lifeline at 475 798 1354. This is open 24 hours a day.  Summary  Perinatal anxiety is when a woman feels excessive tension or worry during pregnancy or during the first 12 months after she gives birth.  Perinatal anxiety may include panic attacks, post-traumatic stress disorder, separation anxiety, phobias, or generalized anxiety.  Perinatal anxiety can cause physical health problems in the mother and baby if not properly managed.  This condition is treated with medicines, talk therapy, stress reduction therapies, or a combination of two or more treatments.  Talk with your partner or family members about your concerns or fears. Do not be afraid to ask for help. This information is not intended to replace advice given to you by your health care provider. Make sure you discuss any questions you have with your health care provider. Document Released: 05/18/2016 Document Revised: 05/18/2016 Document Reviewed: 05/18/2016 Elsevier Interactive Patient Education  2018 [employer].   Living With Obsessive-Compulsive Disorder If you have been diagnosed with obsessive-compulsive disorder (OCD), you may be relieved that you now know why you have felt or behaved a certain way. You may also feel overwhelmed about the treatment  ahead, how to get the support you need, and how to deal with the condition day-to-day. With treatment and support, you can manage your OCD. How to manage lifestyle changes Managing stress Stress is your body's reaction to life changes and events, both good and bad. Stress can play a major role in OCD, so it is important to learn how to cope with stress. Some techniques to cope with stress include:  Meditation, muscle relaxation, and breathing exercises.  Exercise. Even a short daily walk can help to lower stress levels.  Getting enough good-quality sleep.  Spending time on hobbies that you enjoy.  Accepting and letting go of things that you cannot change.  To deal with stress associated with OCD, your health care provider may recommend exposure and response prevention therapy. In this therapy, you will be exposed to  the distressing situation that triggers your compulsion and be prevented from responding to it. With repetition of this process over time, you will no longer feel the distress or need to perform the compulsion. Medicines Your health care provider may suggest certain antidepressant medicines if he or she feels that they will help to improve your condition. Avoid using alcohol and other substances that may prevent your medicines from working properly (may interact). It is also important to:  Talk with your pharmacist or health care provider about all medicines that you take, their possible side effects, and which medicines are safe to take together.  Make it your goal to take part in all treatment decisions (shared decision-making). Ask about possible side effects of medicines that your health care provider recommends, and tell him or her how you feel about having those side effects. It is best if shared decision-making with your health care provider is part of your total treatment plan.  If you are taking medicines as part of your treatment, do not stop taking medicines before you  ask your health care provider if it is safe to stop. You may need to have the medicine slowly decreased (tapered) over time to lower the risk of harmful side effects. Relationships Your family and friends may need to learn about your OCD in order to cope with your condition and support you. Consider giving education materials to friends and family. Family therapy may also help to lower stress and relieve tension. How to recognize changes in your condition Some signs that your condition may be getting worse include:  Being anxious about germs or dirt.  Having harmful thoughts about yourself or others.  Making sure that household objects are alike or perfectly organized in a specific way.  Having great difficulty making decisions, or second-guessing yourself after making a decision.  Constant cleaning and handwashing.  Repeating behavior such as repeatedly checking to see if a door is locked or the oven is off.  Counting nonstop or uncontrollably.  Where to find support Talking with others It may be difficult to tell loved ones about your condition, but they can be a good support system for you. You can work with your therapist to decide whom to tell and when to tell them. Here are some tips for starting the conversation:  Start by sharing your experience with OCD. It is up to you how much detail you want to provide.  Let your loved ones know that you are seeking treatment.  Do not expect loved ones to understand your condition right away.  Finances Not all insurance plans cover mental health care, so it is important to check with your insurance carrier. If paying for co-pays or counseling services is a problem, search for a local or county mental health care center. Public mental health care services may be offered there at a low cost or no cost when you are not able to see a private health care provider. If you are taking medicine for depression, you may be able to get the generic  form, which may be less expensive than brand-name medicine. Some makers of prescription medicines also offer help to patients who cannot afford the medicines they need. Follow these instructions at home:  Check with your health care provider before starting any new prescription or over-the-counter medicines.  Ask for support from trusted family members or friends to make sure you stay on-track with your treatment.  Keep all follow-up visits as told by your health care provider  and therapist. This is important.  Keep a journal to write down your daily moods, medicines, sleep habits, and life events. Doing this may help you have more success with your treatment.  Maintain a healthy lifestyle. Eat a healthy diet, exercise regularly, get plenty of sleep, and take time to relax. Questions to ask your health care provider  If you are taking medicines: ? How long do I need to take medicine? ? Are there any long-term side effects of my medicine? ? Are there any alternatives to taking medicine?  How would I benefit from therapy?  How often should I follow up with a health care provider? Where to find more information:  [employer] OCD Foundation: [employer], operational.iocdf.org  [employer] Mental [employer]ness (NAMI): [employer].br Contact a health care provider if:  Your symptoms get worse or they do not get better with treatment.  You develop new symptoms. Get help right away if:  You have severe side effects after taking your medicine.  You have thoughts about hurting yourself or others. If you ever feel like you may hurt yourself or others, or have thoughts about taking your own life, get help right away. You can go to your nearest emergency department or call:  Your local emergency services (911 in the [place]  A suicide crisis helpline, such as the National Suicide Prevention Lifeline at 641-799-0409. This is open 24  hours a day.  Summary  Stress can play a major role in obsessive-compulsive disorder (OCD). Learning ways to deal with stress may help your treatment work better for you.  If you are taking medicines as part of your treatment, do not stop taking medicines before you ask your health care provider if it is safe to stop.  When talking with family members and friends about your OCD, decide how much detail you want to give them and be patient as they work to understand your condition.  Keep all follow-up visits as told by your health care provider and therapist. This is important. This information is not intended to replace advice given to you by your health care provider. Make sure you discuss any questions you have with your health care provider. Document Released: 07/21/2016 Document Revised: 07/21/2016 Document Reviewed: 07/21/2016 Elsevier Interactive Patient Education  2018 [employer].  Panic Attack A panic attack is a sudden episode of severe anxiety, fear, or discomfort that causes physical and emotional symptoms. The attack may be in response to something frightening, or it may occur for no known reason. Symptoms of a panic attack can be similar to symptoms of a heart attack or stroke. It is important to see your health care provider when you have a panic attack so that these conditions can be ruled out. A panic attack is a symptom of another condition. Most panic attacks go away with treatment of the underlying problem. If you have panic attacks often, you may have a condition called panic disorder. What are the causes? A panic attack may be caused by:  An extreme, life-threatening situation, such as a war or natural disaster.  An anxiety disorder, such as post-traumatic stress disorder.  Depression.  Certain medical conditions, including heart problems, neurological conditions, and infections.  Certain over-the-counter and prescription medicines.  Illegal drugs that increase  heart rate and blood pressure, such as methamphetamine.  Alcohol.  Supplements that increase anxiety.  Panic disorder.  What increases the risk? You are more likely to develop this condition if:  You have an anxiety disorder.  You have  another mental health condition.  You take certain medicines.  You use alcohol, illegal drugs, or other substances.  You are under extreme stress.  A life event is causing increased feelings of anxiety and depression.  What are the signs or symptoms? A panic attack starts suddenly, usually lasts about 20 minutes, and occurs with one or more of the following:  A pounding heart.  A feeling that your heart is beating irregularly or faster than normal (palpitations).  Sweating.  Trembling or shaking.  Shortness of breath or feeling smothered.  Feeling choked.  Chest pain or discomfort.  Nausea or a strange feeling in your stomach.  Dizziness, feeling lightheaded, or feeling like you might faint.  Chills or hot flashes.  Numbness or tingling in your lips, hands, or feet.  Feeling confused, or feeling that you are not yourself.  Fear of losing control or being emotionally unstable.  Fear of dying.  How is this diagnosed? A panic attack is diagnosed with an assessment by your health care provider. During the assessment your health care provider will ask questions about:  Your history of anxiety, depression, and panic attacks.  Your medical history.  Whether you drink alcohol, use illegal drugs, take supplements, or take medicines. Be honest about your substance use.  Your health care provider may also:  Order blood tests or other kinds of tests to rule out serious medical conditions.  Refer you to a mental health professional for further evaluation.  How is this treated? Treatment depends on the cause of the panic attack:  If the cause is a medical problem, your health care provider will either treat that problem or  refer you to a specialist.  If the cause is emotional, you may be given anti-anxiety medicines or referred to a counselor. These medicines may reduce how often attacks happen, reduce how severe the attacks are, and lower anxiety.  If the cause is a medicine, your health care provider may tell you to stop the medicine, change your dose, or take a different medicine.  If the cause is a drug, treatment may involve letting the drug wear off and taking medicine to help the drug leave your body or to counteract its effects. Attacks caused by drug abuse may continue even if you stop using the drug.  Follow these instructions at home:  Take over-the-counter and prescription medicines only as told by your health care provider.  If you feel anxious, limit your caffeine intake.  Take good care of your physical and mental health by: ? Eating a balanced diet that includes plenty of fresh fruits and vegetables, whole grains, lean meats, and low-fat dairy. ? Getting plenty of rest. Try to get 7-8 hours of uninterrupted sleep each night. ? Exercising regularly. Try to get 30 minutes of physical activity at least 5 days a week. ? Not smoking. Talk to your health care provider if you need help quitting. ? Limiting alcohol intake to no more than 1 drink a day for nonpregnant women and 2 drinks a day for men. One drink equals 12 oz of beer, 5 oz of wine, or 1 oz of hard liquor.  Keep all follow-up visits as told by your health care provider. This is important. Panic attacks may have underlying physical or emotional problems that take time to accurately diagnose. Contact a health care provider if:  Your symptoms do not improve, or they get worse.  You are not able to take your medicine as prescribed because of side effects.  Get help right away if:  You have serious thoughts about hurting yourself or others.  You have symptoms of a panic attack. Do not drive yourself to the hospital. Have someone else  drive you or call an ambulance. If you ever feel like you may hurt yourself or others, or you have thoughts about taking your own life, get help right away. You can go to your nearest emergency department or call:  Your local emergency services (911 in the [place]  A suicide crisis helpline, such as the National Suicide Prevention Lifeline at 704-393-9234. This is open 24 hours a day.  Summary  A panic attack is a sign of a serious health or mental health condition. Get help right away. Do not drive yourself to the hospital. Have someone else drive you or call an ambulance.  Always see a health care provider to have the reasons for the panic attack correctly diagnosed.  If your panic attack was caused by a physical problem, follow your health care provider's suggestions for medicine, referral to a specialist, and lifestyle changes.  If your panic attack was caused by an emotional problem, follow through with counseling from a qualified mental health specialist.  If you feel like you may hurt yourself or others, call 911 and get help right away. This information is not intended to replace advice given to you by your health care provider. Make sure you discuss any questions you have with your health care provider. Document Released: 03/21/2005 Document Revised: 04/29/2016 Document Reviewed: 04/29/2016 Elsevier Interactive Patient Education  [employer].

## 2018-02-13 LAB — URINE CULTURE: Culture: NO GROWTH

## 2018-02-15 ENCOUNTER — Ambulatory Visit
Admission: RE | Admit: 2018-02-15 | Discharge: 2018-02-15 | Disposition: A | Discharge status: Home / Self Care | Payer: Medicaid Other | Source: Ambulatory Visit | Attending: Obstetrics and Gynecology | Admitting: Obstetrics and Gynecology

## 2018-02-15 VITALS — BP 121/78 | HR 92 | Temp 98.3°F | Resp 18 | Wt 197.0 lb

## 2018-02-15 DIAGNOSIS — N939 Abnormal uterine and vaginal bleeding, unspecified: Secondary | ICD-10-CM

## 2018-02-15 DIAGNOSIS — O42919 Preterm premature rupture of membranes, unspecified as to length of time between rupture and onset of labor, unspecified trimester: Secondary | ICD-10-CM | POA: Diagnosis not present

## 2018-02-15 DIAGNOSIS — O4693 Antepartum hemorrhage, unspecified, third trimester: Secondary | ICD-10-CM | POA: Diagnosis not present

## 2018-02-15 HISTORY — DX: Anemia, unspecified: D64.9

## 2018-02-15 NOTE — Progress Notes (Signed)
NST  Done for third trimester bleeding , fluid leakage  Baseline 140 moderate variability Pos accels  No decels  No UCS  Start time 10:11 Stop time 10:42  Jill Daniel, Jill Kyer, MD

## 2018-02-19 ENCOUNTER — Ambulatory Visit
Admission: RE | Admit: 2018-02-19 | Discharge: 2018-02-19 | Disposition: A | Discharge status: Home / Self Care | Payer: Medicaid Other | Source: Ambulatory Visit | Attending: Obstetrics & Gynecology | Admitting: Obstetrics & Gynecology

## 2018-02-19 ENCOUNTER — Other Ambulatory Visit: Payer: Self-pay

## 2018-02-19 DIAGNOSIS — O4593 Premature separation of placenta, unspecified, third trimester: Secondary | ICD-10-CM | POA: Diagnosis not present

## 2018-02-19 NOTE — Progress Notes (Addendum)
[facility] [place]  Here for fetal testing. Getting twice-weekly fetal testing with NST's here once weekly and [facility] the other part of the week.  Reports good fetal movement.  No complaints.  No bleeding. No LOF.  Baseline 120 Variability moderate Reactive with no decels Toco: no contractions.  Has NST/AFI here on Thursday Has Growth assessment on Friday at the [facility]  Jill Daniel, Italyhad A, MD

## 2018-02-19 NOTE — Addendum Note (Signed)
Encounter addended by: Jamison OkaSmith, Merek Niu R, RN on: 02/19/2018 7:23 PM  Actions taken: Visit Navigator Flowsheet section accepted

## 2018-02-22 ENCOUNTER — Other Ambulatory Visit: Payer: Self-pay | Admitting: Obstetrics and Gynecology

## 2018-02-22 ENCOUNTER — Ambulatory Visit
Admission: RE | Admit: 2018-02-22 | Discharge: 2018-02-22 | Disposition: A | Discharge status: Home / Self Care | Payer: Medicaid Other | Source: Ambulatory Visit | Attending: Obstetrics and Gynecology | Admitting: Obstetrics and Gynecology

## 2018-02-22 ENCOUNTER — Other Ambulatory Visit: Payer: Self-pay

## 2018-02-22 ENCOUNTER — Ambulatory Visit (HOSPITAL_BASED_OUTPATIENT_CLINIC_OR_DEPARTMENT_OTHER)
Admission: RE | Admit: 2018-02-22 | Discharge: 2018-02-22 | Disposition: A | Discharge status: Home / Self Care | Payer: Medicaid Other | Source: Ambulatory Visit | Attending: Obstetrics and Gynecology | Admitting: Obstetrics and Gynecology

## 2018-02-22 VITALS — BP 132/80 | HR 83 | Temp 98.1°F | Resp 18 | Wt 199.4 lb

## 2018-02-22 DIAGNOSIS — O4593 Premature separation of placenta, unspecified, third trimester: Secondary | ICD-10-CM

## 2018-02-22 DIAGNOSIS — N939 Abnormal uterine and vaginal bleeding, unspecified: Secondary | ICD-10-CM | POA: Diagnosis present

## 2018-02-22 DIAGNOSIS — Z3A32 32 weeks gestation of pregnancy: Secondary | ICD-10-CM | POA: Diagnosis not present

## 2018-02-22 DIAGNOSIS — O4693 Antepartum hemorrhage, unspecified, third trimester: Secondary | ICD-10-CM | POA: Diagnosis present

## 2018-02-22 NOTE — Progress Notes (Signed)
[facility] [place]  Here for fetal testing. Getting twice-weekly fetal testing with NST's here once weekly and [facility]FI at the [facility] the other part of the week.  Reports good fetal movement.  No complaints.  No bleeding. No LOF Done for third trimester bleeding , fluid leakage    Baseline 140 moderate variability Pos accels  No decels  No UCS  Start time 8:07 Stop time 8:47  Jimmey RalphLivingston, Astor Gentle, MD

## 2018-02-26 ENCOUNTER — Ambulatory Visit
Admission: RE | Admit: 2018-02-26 | Discharge: 2018-02-26 | Disposition: A | Discharge status: Home / Self Care | Payer: Medicaid Other | Source: Ambulatory Visit | Attending: Maternal & Fetal Medicine | Admitting: Maternal & Fetal Medicine

## 2018-02-26 ENCOUNTER — Other Ambulatory Visit: Payer: Self-pay

## 2018-02-26 DIAGNOSIS — Z3A33 33 weeks gestation of pregnancy: Secondary | ICD-10-CM | POA: Insufficient documentation

## 2018-02-26 DIAGNOSIS — O4593 Premature separation of placenta, unspecified, third trimester: Secondary | ICD-10-CM

## 2018-02-26 DIAGNOSIS — O4693 Antepartum hemorrhage, unspecified, third trimester: Secondary | ICD-10-CM | POA: Diagnosis present

## 2018-02-26 DIAGNOSIS — O42913 Preterm premature rupture of membranes, unspecified as to length of time between rupture and onset of labor, third trimester: Secondary | ICD-10-CM | POA: Insufficient documentation

## 2018-02-26 DIAGNOSIS — O42919 Preterm premature rupture of membranes, unspecified as to length of time between rupture and onset of labor, unspecified trimester: Secondary | ICD-10-CM

## 2018-02-26 NOTE — Progress Notes (Addendum)
MFM Report--[facility] [place]   Antenatal testing performed and US (see report) due to IUGR NST 140s reactive Moderate variability AFI 18 UA S/D ratio 3.02 (normal)

## 2018-03-08 ENCOUNTER — Other Ambulatory Visit: Payer: Self-pay

## 2018-03-08 ENCOUNTER — Other Ambulatory Visit: Payer: Medicaid Other

## 2018-03-08 DIAGNOSIS — O365930 Maternal care for other known or suspected poor fetal growth, third trimester, not applicable or unspecified: Secondary | ICD-10-CM

## 2018-03-10 ENCOUNTER — Inpatient Hospital Stay
Admission: EM | Admit: 2018-03-10 | Discharge: 2018-03-10 | Discharge status: Against Medical Advice | Payer: Medicaid Other | Attending: Obstetrics and Gynecology | Admitting: Obstetrics and Gynecology

## 2018-03-10 MED ORDER — OXYCODONE HCL 5 MG PO TABS
10.00 | ORAL_TABLET | ORAL | Status: DC
Start: ? — End: 2018-03-10

## 2018-03-10 MED ORDER — KETOROLAC TROMETHAMINE 15 MG/ML IJ SOLN
15.00 | INTRAMUSCULAR | Status: DC
Start: 2018-03-10 — End: 2018-03-10

## 2018-03-10 MED ORDER — ACETAMINOPHEN 325 MG PO TABS
975.00 | ORAL_TABLET | ORAL | Status: DC
Start: 2018-03-10 — End: 2018-03-10

## 2018-03-10 MED ORDER — IBUPROFEN 600 MG PO TABS
600.00 | ORAL_TABLET | ORAL | Status: DC
Start: 2018-03-11 — End: 2018-03-10

## 2018-03-10 MED ORDER — LACTATED RINGERS IV SOLN
INTRAVENOUS | Status: DC
Start: ? — End: 2018-03-10

## 2018-03-10 MED ORDER — NALOXONE HCL 0.4 MG/ML IJ SOLN
0.40 | INTRAMUSCULAR | Status: DC
Start: ? — End: 2018-03-10

## 2018-03-10 MED ORDER — ONDANSETRON HCL 4 MG/2ML IJ SOLN
4.00 | INTRAMUSCULAR | Status: DC
Start: ? — End: 2018-03-10

## 2018-03-10 MED ORDER — GENERIC EXTERNAL MEDICATION
6.25 | Status: DC
Start: ? — End: 2018-03-10

## 2018-03-10 MED ORDER — SCOPOLAMINE 1 MG/3DAYS TD PT72
1.00 | MEDICATED_PATCH | TRANSDERMAL | Status: DC
Start: 2018-03-13 — End: 2018-03-10

## 2018-03-10 MED ORDER — LIDOCAINE HCL 1 % IJ SOLN
1.00 | INTRAMUSCULAR | Status: DC
Start: ? — End: 2018-03-10

## 2018-03-10 MED ORDER — LIDOCAINE HCL 1 % IJ SOLN
0.50 | INTRAMUSCULAR | Status: DC
Start: ? — End: 2018-03-10

## 2018-03-10 MED ORDER — OXYCODONE HCL 5 MG PO TABS
5.00 | ORAL_TABLET | ORAL | Status: DC
Start: ? — End: 2018-03-10

## 2018-03-10 MED ORDER — GENERIC EXTERNAL MEDICATION
2.50 | Status: DC
Start: ? — End: 2018-03-10

## 2018-03-10 NOTE — OB Triage Note (Signed)
Pt arrived to floor via WC from ED with mother. C/O "water broke." Once in room pt requested RN to "go ahead and call [facility]." Pt stated she plans to deliver @ [facility]. Notified pt that she would have to be evaluated by a provider here and the decision would have to be made by the provider to be transferred to another facility should she be stable enough to transfer. At that time pt and pt's mother stated [facility]. AMA form signed by pt. Provider on call for Paulino DoorKC, [place], CNM notified. Pt. left floor via WC with mother. Elaina HoopsElks, Simcha Farrington S

## 2018-03-12 ENCOUNTER — Other Ambulatory Visit: Payer: Medicaid Other

## 2018-03-12 ENCOUNTER — Inpatient Hospital Stay: Admission: RE | Admit: 2018-03-12 | Payer: Medicaid Other | Source: Ambulatory Visit

## 2018-03-15 ENCOUNTER — Other Ambulatory Visit: Payer: Medicaid Other

## 2018-03-22 ENCOUNTER — Other Ambulatory Visit: Payer: Medicaid Other

## 2018-03-29 ENCOUNTER — Other Ambulatory Visit: Payer: Medicaid Other

## 2018-04-05 ENCOUNTER — Other Ambulatory Visit: Payer: Medicaid Other

## 2018-07-03 ENCOUNTER — Encounter (HOSPITAL_COMMUNITY): Payer: Self-pay

## 2019-03-30 ENCOUNTER — Emergency Department (HOSPITAL_COMMUNITY): Payer: Medicaid Other

## 2019-03-30 ENCOUNTER — Encounter (HOSPITAL_COMMUNITY): Payer: Self-pay | Admitting: Emergency Medicine

## 2019-03-30 ENCOUNTER — Observation Stay (HOSPITAL_COMMUNITY)
Admission: EM | Admit: 2019-03-30 | Discharge: 2019-04-01 | Disposition: A | Discharge status: Home / Self Care | Payer: Medicaid Other | Attending: Internal Medicine | Admitting: Internal Medicine

## 2019-03-30 DIAGNOSIS — F419 Anxiety disorder, unspecified: Secondary | ICD-10-CM | POA: Diagnosis not present

## 2019-03-30 DIAGNOSIS — Z87891 Personal history of nicotine dependence: Secondary | ICD-10-CM | POA: Diagnosis not present

## 2019-03-30 DIAGNOSIS — Z20828 Contact with and (suspected) exposure to other viral communicable diseases: Secondary | ICD-10-CM | POA: Insufficient documentation

## 2019-03-30 DIAGNOSIS — F319 Bipolar disorder, unspecified: Secondary | ICD-10-CM | POA: Diagnosis not present

## 2019-03-30 DIAGNOSIS — M545 Low back pain: Secondary | ICD-10-CM | POA: Insufficient documentation

## 2019-03-30 DIAGNOSIS — D649 Anemia, unspecified: Secondary | ICD-10-CM | POA: Diagnosis not present

## 2019-03-30 DIAGNOSIS — R202 Paresthesia of skin: Secondary | ICD-10-CM | POA: Insufficient documentation

## 2019-03-30 DIAGNOSIS — R2689 Other abnormalities of gait and mobility: Secondary | ICD-10-CM | POA: Insufficient documentation

## 2019-03-30 DIAGNOSIS — R2681 Unsteadiness on feet: Secondary | ICD-10-CM | POA: Diagnosis not present

## 2019-03-30 DIAGNOSIS — Z888 Allergy status to other drugs, medicaments and biological substances status: Secondary | ICD-10-CM | POA: Diagnosis not present

## 2019-03-30 DIAGNOSIS — R531 Weakness: Secondary | ICD-10-CM | POA: Diagnosis not present

## 2019-03-30 DIAGNOSIS — R29898 Other symptoms and signs involving the musculoskeletal system: Secondary | ICD-10-CM

## 2019-03-30 DIAGNOSIS — Z79899 Other long term (current) drug therapy: Secondary | ICD-10-CM | POA: Insufficient documentation

## 2019-03-30 DIAGNOSIS — M25551 Pain in right hip: Secondary | ICD-10-CM | POA: Insufficient documentation

## 2019-03-30 DIAGNOSIS — W19XXXA Unspecified fall, initial encounter: Secondary | ICD-10-CM

## 2019-03-30 DIAGNOSIS — M549 Dorsalgia, unspecified: Secondary | ICD-10-CM | POA: Diagnosis present

## 2019-03-30 DIAGNOSIS — W109XXA Fall (on) (from) unspecified stairs and steps, initial encounter: Secondary | ICD-10-CM | POA: Insufficient documentation

## 2019-03-30 DIAGNOSIS — Z114 Encounter for screening for human immunodeficiency virus [HIV]: Secondary | ICD-10-CM

## 2019-03-30 DIAGNOSIS — R200 Anesthesia of skin: Secondary | ICD-10-CM

## 2019-03-30 LAB — COMPREHENSIVE METABOLIC PANEL
ALT: 18 U/L (ref 0–44)
AST: 18 U/L (ref 15–41)
Albumin: 3.8 g/dL (ref 3.5–5.0)
Alkaline Phosphatase: 65 U/L (ref 38–126)
Anion gap: 9 (ref 5–15)
BUN: 7 mg/dL (ref 6–20)
CO2: 27 mmol/L (ref 22–32)
Calcium: 9.3 mg/dL (ref 8.9–10.3)
Chloride: 104 mmol/L (ref 98–111)
Creatinine, Ser: 0.84 mg/dL (ref 0.44–1.00)
GFR calc Af Amer: >60 mL/min (ref >60)
GFR calc non Af Amer: >60 mL/min (ref >60)
Glucose, Bld: 97 mg/dL (ref 70–99)
Potassium: 4 mmol/L (ref 3.5–5.1)
Sodium: 140 mmol/L (ref 135–145)
Total Bilirubin: 0.3 mg/dL (ref 0.3–1.2)
Total Protein: 6.6 g/dL (ref 6.5–8.1)

## 2019-03-30 LAB — CBC WITH DIFFERENTIAL/PLATELET
Abs Immature Granulocytes: 0.02 K/uL (ref 0.00–0.07)
Basophils Absolute: 0.1 K/uL (ref 0.0–0.1)
Basophils Relative: 1 %
Eosinophils Absolute: 0.3 K/uL (ref 0.0–0.5)
Eosinophils Relative: 4 %
HCT: 41.7 % (ref 36.0–46.0)
Hemoglobin: 13.2 g/dL (ref 12.0–15.0)
Immature Granulocytes: 0 %
Lymphocytes Relative: 36 %
Lymphs Abs: 2.9 K/uL (ref 0.7–4.0)
MCH: 27.3 pg (ref 26.0–34.0)
MCHC: 31.7 g/dL (ref 30.0–36.0)
MCV: 86.3 fL (ref 80.0–100.0)
Monocytes Absolute: 0.4 K/uL (ref 0.1–1.0)
Monocytes Relative: 5 %
Neutro Abs: 4.3 K/uL (ref 1.7–7.7)
Neutrophils Relative %: 54 %
Platelets: 336 K/uL (ref 150–400)
RBC: 4.83 MIL/uL (ref 3.87–5.11)
RDW: 12.8 % (ref 11.5–15.5)
WBC: 8.1 K/uL (ref 4.0–10.5)
nRBC: 0 % (ref 0.0–0.2)

## 2019-03-30 LAB — I-STAT BETA HCG BLOOD, ED (MC, WL, AP ONLY): I-stat hCG, quantitative: <5 m[IU]/mL (ref <5)

## 2019-03-30 LAB — TYPE AND SCREEN
ABO/RH(D): O POS
Antibody Screen: NEGATIVE

## 2019-03-30 LAB — RESPIRATORY PANEL BY RT PCR (FLU A&B, COVID)
Influenza A by PCR: NEGATIVE
Influenza B by PCR: NEGATIVE
SARS Coronavirus 2 by RT PCR: NEGATIVE

## 2019-03-30 LAB — I-STAT CREATININE, ED: Creatinine, Ser: 0.8 mg/dL (ref 0.44–1.00)

## 2019-03-30 MED ORDER — SODIUM CHLORIDE 0.9 % IV BOLUS
1000.0000 mL | Freq: Once | INTRAVENOUS | Status: AC
  Administered 2019-03-30: 1000 mL via INTRAVENOUS

## 2019-03-30 MED ORDER — MORPHINE SULFATE (PF) 4 MG/ML IV SOLN: 4.0000 mg | Freq: Once | INTRAVENOUS | Status: DC

## 2019-03-30 MED ORDER — FENTANYL CITRATE (PF) 100 MCG/2ML IJ SOLN
50.0000 ug | INTRAMUSCULAR | Status: AC | PRN
  Administered 2019-03-30 (×2): 50 ug via INTRAVENOUS
  Filled 2019-03-30 (×2): qty 2

## 2019-03-30 MED ORDER — SODIUM CHLORIDE 0.9 % IV BOLUS: 125.0000 mL | Freq: Once | INTRAVENOUS | Status: DC

## 2019-03-30 MED ORDER — ONDANSETRON HCL 4 MG/2ML IJ SOLN
4.0000 mg | Freq: Once | INTRAMUSCULAR | Status: AC | PRN
  Administered 2019-03-30: 4 mg via INTRAVENOUS
  Filled 2019-03-30: qty 2

## 2019-03-30 NOTE — ED Provider Notes (Signed)
[facility] EMERGENCY DEPARTMENT Provider Note   CSN: [id number] Arrival date & time: 03/30/19  1944     History Chief Complaint  Patient presents with  . Fall    Jill Daniel is a 28 y.o. female with a past medical history of bipolar 1, seizures, who presents today for evaluation after a fall.  She reports that she was carrying her daughter down the steps when she fell down the last 4 steps landing on her knees.  She has been unable to get up.  She reports that her right leg is completely numb and that she is unable to move it.  She denies striking her head.  No loss of consciousness.  She reports pain in her right hip primarily and in her lower back. No interventions tried PTA.  Pain is made worse with palpation and movement.  Made better with fentanyl and not moving.   She denies any blood thinner use.  Last oral intake was about 4 PM tonight.  Pain is made worse with movement.  HPI     Past Medical History:  Diagnosis Date  . Anemia   . Anxiety   . Bipolar 1 disorder (HCC)   . Seizures [facility])     Patient Active Problem List   Diagnosis Date Noted  . Placental abruption in third trimester 02/19/2018  . Vaginal bleeding, abnormal 01/31/2018  . Vaginal bleeding in pregnancy, third trimester 01/21/2018  . Preterm premature rupture of membranes (PPROM) with unknown onset of labor 01/21/2018  . Bipolar I disorder (HCC) 11/16/2015  . Panic attacks 11/16/2015    Past Surgical History:  Procedure Laterality Date  . ADENOIDECTOMY    . tailbone    . TONSILLECTOMY    . WISDOM TOOTH EXTRACTION       OB History    Gravida  3   Para      Term      Preterm      AB  2   Living        SAB  2   TAB      Ectopic      Multiple      Live Births              Family History  Problem Relation Age of Onset  . Depression Mother   . Depression Sister     Social History   Tobacco Use  . Smoking status: Former Smoker    Packs/day:  0.00  . Smokeless tobacco: Never Used  Substance Use Topics  . Alcohol use: No    Comment: Socially  . Drug use: No    Home Medications Prior to Admission medications   Medication Sig Start Date End Date Taking? Authorizing Provider  ARIPiprazole (ABILIFY) 10 MG tablet Take 10 mg by mouth at bedtime. 11/17/18  Yes [provider]  escitalopram (LEXAPRO) 10 MG tablet Take 10 mg by mouth daily. 02/06/19  Yes [provider]    Allergies    Decadron [dexamethasone], Grapefruit extract, and Tape  Review of Systems   Review of Systems  Constitutional: Negative for chills and fever.  HENT: Negative for congestion.   Respiratory: Negative for cough, chest tightness and shortness of breath.   Cardiovascular: Negative for chest pain.  Gastrointestinal: Negative for abdominal pain, nausea and vomiting.  Musculoskeletal: Positive for back pain. Negative for neck pain.  Skin: Negative for color change, rash and wound.  Neurological: Positive for weakness and numbness. Negative for  syncope and headaches.  All other systems reviewed and are negative.   Physical Exam Updated Vital Signs BP 103/70   Pulse (!) 56   Temp 98.7 F (37.1 C) (Oral)   Resp 19   SpO2 99%   Physical Exam Vitals and nursing note reviewed.  Constitutional:      General: She is not in acute distress.    Appearance: She is well-developed. She is not ill-appearing.  HENT:     Head: Normocephalic and atraumatic.  Eyes:     Conjunctiva/sclera: Conjunctivae normal.  Cardiovascular:     Rate and Rhythm: Normal rate and regular rhythm.     Pulses: Normal pulses.     Heart sounds: No murmur.  Pulmonary:     Effort: Pulmonary effort is normal. No respiratory distress.     Breath sounds: Normal breath sounds.  Abdominal:     Palpations: Abdomen is soft.     Tenderness: There is no abdominal tenderness.  Musculoskeletal:     Cervical back: Normal range of motion and neck supple. No rigidity.      Comments: Patient is reportedly unable to move her right leg on exam.  Compartments in upper and lower right leg are soft and easily compressible.  Right leg without palpable crepitus or deformities.  Right patella feels to be in appropriate location.  When attempting passive range of motion of the right leg causes increased lower back pain and pain in her right hip. Diffuse TTP over entire right low back and midline lower back with out palpable step-offs or deformity.  T and C spine with out midline TTP, step offs or deformity.    Skin:    General: Skin is warm and dry.  Neurological:     Mental Status: She is alert.     Comments: Patient reports no sensation on the entire right leg from the hip down.  She reports inability to move the right leg. Normal sensation and strength in the left leg, however when left leg is raised that does cause lower back pain.  Left leg with 5/5 ankle dorsiflexion and plantar flexion.     ED Results / Procedures / Treatments   Labs (all labs ordered are listed, but only abnormal results are displayed) Labs Reviewed  RESPIRATORY PANEL BY RT PCR (FLU A&B, COVID)  COMPREHENSIVE METABOLIC PANEL  CBC WITH DIFFERENTIAL/PLATELET  I-STAT BETA HCG BLOOD, ED ([facility], [facility], [facility] ONLY)  I-STAT BETA HCG BLOOD, ED ([facility], [facility], [facility] ONLY)  I-STAT CREATININE, ED  TYPE AND SCREEN  ABO/RH    EKG EKG Interpretation  Date/Time:  Saturday March 30 2019 21:24:31 EST Ventricular Rate:  72 PR Interval:    QRS Duration: 97 QT Interval:  374 QTC Calculation: 410 R Axis:   9 Text Interpretation: Sinus rhythm Probable left ventricular hypertrophy Confirmed by Virgina Norfolk [phone]) on 03/30/2019 10:01:59 PM   Radiology CT ABDOMEN PELVIS WO CONTRAST  Result Date: 03/30/2019 CLINICAL DATA:  Low back pain. Fall. EXAM: CT ABDOMEN AND PELVIS WITHOUT CONTRAST TECHNIQUE: Multidetector CT imaging of the abdomen and pelvis was performed following the standard protocol without IV contrast.  COMPARISON:  05/01/2017 FINDINGS: LOWER CHEST: No basilar pleural or apical pericardial effusion. HEPATOBILIARY: Normal hepatic contours. No intra- or extrahepatic biliary dilatation. Normal gallbladder. PANCREAS: Normal pancreas. No ductal dilatation or peripancreatic fluid collection. SPLEEN: Normal. ADRENALS/URINARY TRACT: --Adrenal glands: Normal. --Kidneys and ureters: No hydronephrosis, nephroureterolithiasis or solid renal mass. --Urinary bladder: Normal for degree of distention STOMACH/BOWEL: --Stomach/Duodenum: No hiatal  hernia. Normal duodenal course and caliber. --Small bowel: No dilatation or inflammation. --Colon: No focal abnormality. --Appendix: Normal. VASCULAR/LYMPHATIC: Normal course and caliber of the major abdominal vessels. No abdominal or pelvic lymphadenopathy. REPRODUCTIVE: There is a T-shaped contraceptive device within the uterus. MUSCULOSKELETAL. No bony spinal canal stenosis or focal osseous abnormality. OTHER: None. IMPRESSION: No acute abdominopelvic abnormality. Electronically Signed   By: Deatra RobinsonKevin  Herman M.D.   On: 03/30/2019 23:26   DG Pelvis Portable  Result Date: 03/30/2019 CLINICAL DATA:  Fall, severe left hip pain, left leg neuro deficit EXAM: PORTABLE PELVIS 1-2 VIEWS COMPARISON:  None. FINDINGS: Bones of the pelvis are intact. No abnormal diastatic widening of the symphysis pubis or SI joints. Femoral heads are normally located. No proximal femoral fractures are identified. There are 2 radiopaque IUDs projecting over the level of the pelvis. Remaining soft tissues are unremarkable. IMPRESSION: No acute traumatic findings in the pelvis. 2 IUDs visualized in the pelvis.  Correlate with procedural history. Electronically Signed   By: Kreg ShropshirePrice  DeHay M.D.   On: 03/30/2019 22:57   CT L-SPINE NO CHARGE  Result Date: 03/30/2019 CLINICAL DATA:  Fall EXAM: CT LUMBAR SPINE WITHOUT CONTRAST TECHNIQUE: Multidetector CT imaging of the lumbar spine was performed without intravenous  contrast administration. Multiplanar CT image reconstructions were also generated. COMPARISON:  None. FINDINGS: Segmentation: 5 lumbar type vertebrae. Alignment: Normal. Vertebrae: No acute fracture or focal pathologic process. Paraspinal and other soft tissues: Negative. Disc levels: No spinal canal stenosis. IMPRESSION: No acute fracture or static subluxation of the lumbar spine. Electronically Signed   By: Deatra RobinsonKevin  Herman M.D.   On: 03/30/2019 23:22   DG Chest Portable 1 View  Result Date: 03/30/2019 CLINICAL DATA:  Fall EXAM: PORTABLE CHEST 1 VIEW COMPARISON:  None. FINDINGS: No consolidation, features of edema, pneumothorax, or effusion. Pulmonary vascularity is normally distributed. The cardiomediastinal contours are unremarkable. No acute osseous or soft tissue abnormality. Telemetry leads overlie the chest. IMPRESSION: No acute cardiopulmonary abnormality. Electronically Signed   By: Kreg ShropshirePrice  DeHay M.D.   On: 03/30/2019 23:00    Procedures Procedures (including critical care time)  Medications Ordered in ED Medications  ondansetron (ZOFRAN) injection 4 mg (4 mg Intravenous Given 03/30/19 2135)  sodium chloride 0.9 % bolus 1,000 mL (0 mLs Intravenous Stopped 03/30/19 2256)  fentaNYL (SUBLIMAZE) injection 50 mcg (50 mcg Intravenous Given 03/30/19 2147)    ED Course  I have reviewed the triage vital signs and the nursing notes.  Pertinent labs & imaging results that were available during my care of the patient were reviewed by me and considered in my medical decision making (see chart for details).  Clinical Course as of Mar 30 112  Sat Mar 30, 2019  2101 Spoke with MRI who reports paraguard is MRI ok.    [EH]  2218 CT scan called, they state that it is less radiation instead of getting a CT L-spine and a CT pelvis without contrast to get a CT abdomen pelvis without contrast tonight L-spine no charge.  This order has been changed.   [EH]  2320 Patient reevaluated.  Her physical exam is  unchanged.  Will pursue MRI.   [EH]    Clinical Course User Index [EH] Norman ClayHammond, Titilayo Hagans W, PA-C   MDM Rules/Calculators/A&P                     Patient presents today for evaluation after a fall.  She was reportedly walking down the steps when she fell down  the last for landing on her knees.  She denies striking her head or passing out.  No pain in her neck or head.  She reports inability to feel or move her entire right leg from the hip down.  Her pain was treated while in the emergency room.  Images were delayed due to waiting to obtain adequate pain control for patient to be able to lay flat.    CBC, CMP, without hematologic or electrolyte derangements.  Respiratory panel by RT-PCR was negative for Covid, flu a and B.  Pregnancy test is negative.  Plain films of the chest and pelvis were obtained without visualized abnormalities.  After discussions with CT tech, they report that the least amount of radiation is to do a CT abdomen pelvis without contrast and a L-spine no charge instead of just a L-spine and pelvis.  CT scans without cause for patient's condition.  Spoke with Dr. Cheral Marker on the phone who recommended MRI without contrast of the lumbar spine.  Patient has a septate uterus so she has 2 copper ParaGard IUDs in place according to her notes from [facility].  I spoke with MRI tech who reports that ParaGard is MRI compatible.  At shift change care was transferred to [facility] who will follow pending studies, re-evaulate and determine disposition.    Final Clinical Impression(s) / ED Diagnoses Final diagnoses:  Back pain    Rx / DC Orders ED Discharge Orders    None       Lorin Glass, PA-C 03/31/19 0119    Lennice Sites, DO 03/31/19 0122

## 2019-03-30 NOTE — ED Triage Notes (Addendum)
Pt presents to ED from home BIB [facility]. Pt c/o fall this evening resulting from per pt hx R knee weakness. Pt fell down 4 stairs, landed on knees. Unable to gt up and ambulate post fall. Pt c/o R leg numbness, weakness, and pain extending from R hip. No LOC, did not hit head. EMS VSS. EMS gave 100 mcg fentanyl.

## 2019-03-31 ENCOUNTER — Emergency Department (HOSPITAL_COMMUNITY): Payer: Medicaid Other

## 2019-03-31 DIAGNOSIS — F319 Bipolar disorder, unspecified: Secondary | ICD-10-CM

## 2019-03-31 DIAGNOSIS — F419 Anxiety disorder, unspecified: Secondary | ICD-10-CM

## 2019-03-31 DIAGNOSIS — R29898 Other symptoms and signs involving the musculoskeletal system: Secondary | ICD-10-CM | POA: Diagnosis not present

## 2019-03-31 DIAGNOSIS — R200 Anesthesia of skin: Secondary | ICD-10-CM

## 2019-03-31 DIAGNOSIS — Z114 Encounter for screening for human immunodeficiency virus [HIV]: Secondary | ICD-10-CM

## 2019-03-31 LAB — HIV ANTIBODY (ROUTINE TESTING W REFLEX): HIV Screen 4th Generation wRfx: NONREACTIVE

## 2019-03-31 LAB — ABO/RH: ABO/RH(D): O POS

## 2019-03-31 MED ORDER — CHLORHEXIDINE GLUCONATE CLOTH 2 % EX PADS
6.0000 | MEDICATED_PAD | Freq: Every day | CUTANEOUS | Status: DC
  Administered 2019-03-31: 6 via TOPICAL

## 2019-03-31 MED ORDER — ACETAMINOPHEN 325 MG PO TABS
650.0000 mg | ORAL_TABLET | Freq: Four times a day (QID) | ORAL | Status: DC | PRN
  Administered 2019-03-31 (×2): 650 mg via ORAL
  Filled 2019-03-31 (×3): qty 2

## 2019-03-31 MED ORDER — METHOCARBAMOL 1000 MG/10ML IJ SOLN
1000.0000 mg | Freq: Three times a day (TID) | INTRAVENOUS | Status: DC | PRN
  Administered 2019-04-01 (×2): 1000 mg via INTRAVENOUS
  Filled 2019-03-31 (×3): qty 10

## 2019-03-31 MED ORDER — ACETAMINOPHEN 650 MG RE SUPP: 650.0000 mg | Freq: Four times a day (QID) | RECTAL | Status: DC | PRN

## 2019-03-31 MED ORDER — METHOCARBAMOL 1000 MG/10ML IJ SOLN
500.0000 mg | Freq: Four times a day (QID) | INTRAVENOUS | Status: DC | PRN
  Administered 2019-03-31: 500 mg via INTRAVENOUS
  Filled 2019-03-31 (×3): qty 5

## 2019-03-31 MED ORDER — KETOROLAC TROMETHAMINE 30 MG/ML IJ SOLN
30.0000 mg | Freq: Four times a day (QID) | INTRAMUSCULAR | Status: DC
  Administered 2019-03-31 – 2019-04-01 (×5): 30 mg via INTRAVENOUS
  Filled 2019-03-31 (×5): qty 1

## 2019-03-31 MED ORDER — OXYCODONE HCL 5 MG PO TABS
5.0000 mg | ORAL_TABLET | ORAL | Status: DC | PRN
  Administered 2019-03-31: 10 mg via ORAL
  Administered 2019-03-31 (×2): 5 mg via ORAL
  Administered 2019-04-01: 10 mg via ORAL
  Filled 2019-03-31: qty 2
  Filled 2019-03-31 (×2): qty 1
  Filled 2019-03-31: qty 2

## 2019-03-31 MED ORDER — ESCITALOPRAM OXALATE 10 MG PO TABS
10.0000 mg | ORAL_TABLET | Freq: Every day | ORAL | Status: DC
  Administered 2019-03-31 – 2019-04-01 (×2): 10 mg via ORAL
  Filled 2019-03-31 (×2): qty 1

## 2019-03-31 MED ORDER — ARIPIPRAZOLE 5 MG PO TABS
10.0000 mg | ORAL_TABLET | Freq: Every day | ORAL | Status: DC
  Administered 2019-03-31: 10 mg via ORAL
  Filled 2019-03-31 (×2): qty 2

## 2019-03-31 MED ORDER — ENOXAPARIN SODIUM 40 MG/0.4ML ~~LOC~~ SOLN
40.0000 mg | SUBCUTANEOUS | Status: DC
  Administered 2019-03-31 – 2019-04-01 (×2): 40 mg via SUBCUTANEOUS
  Filled 2019-03-31 (×2): qty 0.4

## 2019-03-31 MED ORDER — LORAZEPAM 2 MG/ML IJ SOLN
1.0000 mg | Freq: Once | INTRAMUSCULAR | Status: AC
  Administered 2019-03-31: 1 mg via INTRAVENOUS
  Filled 2019-03-31: qty 1

## 2019-03-31 NOTE — Progress Notes (Signed)
PROGRESS NOTE    Jill Ciscoatasha A Choinski   UEA:[id number]:[id number]  DOB: 02/14/1991  DOA: 03/30/2019 PCP: Patient, No Pcp Per   Brief Narrative:  Jill Daniel is a 28 y.o. female with medical history significant of bipolar 1 disorder, seizures, anxiety, anemia presenting to the ED for evaluation after a fall.  Patient states she was carrying her daughter down the steps and all of a sudden her knees gave out and she fell.  Denies any head injury from the fall.  No loss of consciousness.  She fell down the last few steps and landed on her right hip.  States since the fall she is having tingling and numbness of her entire right leg from the hip down.  She has not been able to move this leg at all.  She is also having pain in her right hip and lower back.  No other complaints.   Subjective: Pain in right lower back, hip and down entire leg. She also has some stiffness in her upper back.    Assessment & Plan:   Principal Problem:   Pain and weakness in right back and leg - due to fall down the stairs- MRI L spine and CT spine unremarkable - starting Toradol and Robaxin- have asked RN to alternate heat and ice.   Active Problems:   Bipolar I disorder    Anxiety - cont Abilify, Lexapro   Time spent in minutes: 35 DVT prophylaxis: Lovenox Code Status: Full code Family Communication:  Disposition Plan: home tomorrow Consultants:   neurology Procedures:    Antimicrobials:  Anti-infectives (From admission, onward)   None       Objective: Vitals:   03/31/19 0500 03/31/19 0515 03/31/19 0545 03/31/19 0800  BP: 104/71 103/71 102/61 106/65  Pulse:  63 61 (!) 58  Resp:   17 16  Temp:   98.4 F (36.9 C) 98.4 F (36.9 C)  TempSrc:   Oral Oral  SpO2:  96% 96% 99%   No intake or output data in the 24 hours ending 03/31/19 1345 There were no vitals filed for this visit.  Examination: General exam: Appears comfortable  HEENT: PERRLA, oral mucosa moist, no sclera icterus or  thrush Respiratory system: Clear to auscultation. Respiratory effort normal. Cardiovascular system: S1 & S2 heard, RRR.   Gastrointestinal system: Abdomen soft, non-tender, nondistended. Normal bowel sounds. Central nervous system: Alert and oriented. No focal neurological deficits. Extremities: No cyanosis, clubbing - tenderness and pain on movement in right lumbar spine, hip, knee and ankle Skin: No rashes or ulcers Psychiatry:  depressed     Data Reviewed: I have personally reviewed following labs and imaging studies  CBC: Recent Labs  Lab 03/30/19 2116  WBC 8.1  NEUTROABS 4.3  HGB 13.2  HCT 41.7  MCV 86.3  PLT 336   Basic Metabolic Panel: Recent Labs  Lab 03/30/19 2116 03/30/19 2140  NA 140  --   K 4.0  --   CL 104  --   CO2 27  --   GLUCOSE 97  --   BUN 7  --   CREATININE 0.84 0.80  CALCIUM 9.3  --    GFR: CrCl cannot be calculated (Unknown ideal weight.). Liver Function Tests: Recent Labs  Lab 03/30/19 2116  AST 18  ALT 18  ALKPHOS 65  BILITOT 0.3  PROT 6.6  ALBUMIN 3.8   No results for input(s): LIPASE, AMYLASE in the last 168 hours. No results for input(s): AMMONIA in the last 168  hours. Coagulation Profile: No results for input(s): INR, PROTIME in the last 168 hours. Cardiac Enzymes: No results for input(s): CKTOTAL, CKMB, CKMBINDEX, TROPONINI in the last 168 hours. BNP (last 3 results) No results for input(s): PROBNP in the last 8760 hours. HbA1C: No results for input(s): HGBA1C in the last 72 hours. CBG: No results for input(s): GLUCAP in the last 168 hours. Lipid Profile: No results for input(s): CHOL, HDL, LDLCALC, TRIG, CHOLHDL, LDLDIRECT in the last 72 hours. Thyroid Function Tests: No results for input(s): TSH, T4TOTAL, FREET4, T3FREE, THYROIDAB in the last 72 hours. Anemia Panel: No results for input(s): VITAMINB12, FOLATE, FERRITIN, TIBC, IRON, RETICCTPCT in the last 72 hours. Urine analysis:    Component Value Date/Time    COLORURINE YELLOW (A) 02/12/2018 1542   APPEARANCEUR HAZY (A) 02/12/2018 1542   LABSPEC 1.006 02/12/2018 1542   PHURINE 6.0 02/12/2018 1542   GLUCOSEU NEGATIVE 02/12/2018 1542   HGBUR NEGATIVE 02/12/2018 1542   [place] 02/12/2018 1542   KETONESUR NEGATIVE 02/12/2018 1542   PROTEINUR NEGATIVE 02/12/2018 1542   NITRITE NEGATIVE 02/12/2018 1542   LEUKOCYTESUR NEGATIVE 02/12/2018 1542   Sepsis Labs: @LABRCNTIP (procalcitonin:4,lacticidven:4) ) Recent Results (from the past 240 hour(s))  Respiratory Panel by RT PCR (Flu A&B, Covid) - Nasopharyngeal Swab     Status: None   Collection Time: 03/30/19  9:16 PM   Specimen: Nasopharyngeal Swab  Result Value Ref Range Status   SARS Coronavirus 2 by RT PCR NEGATIVE NEGATIVE Final    Comment: (NOTE) SARS-CoV-2 target nucleic acids are NOT DETECTED. The SARS-CoV-2 RNA is generally detectable in upper respiratoy specimens during the acute phase of infection. The lowest concentration of SARS-CoV-2 viral copies this assay can detect is 131 copies/mL. A negative result does not preclude SARS-Cov-2 infection and should not be used as the sole basis for treatment or other patient management decisions. A negative result may occur with  improper specimen collection/handling, submission of specimen other than nasopharyngeal swab, presence of viral mutation(s) within the areas targeted by this assay, and inadequate number of viral copies (<131 copies/mL). A negative result must be combined with clinical observations, patient history, and epidemiological information. The expected result is Negative. Fact Sheet for Patients:  PinkCheek.be Fact Sheet for Healthcare Providers:  GravelBags.it This test is not yet ap proved or cleared by the [place] FDA and  has been authorized for detection and/or diagnosis of SARS-CoV-2 by FDA under an Emergency Use Authorization (EUA). This EUA  will remain  in effect (meaning this test can be used) for the duration of the COVID-19 declaration under Section 564(b)(1) of the Act, 21 U.S.C. section 360bbb-3(b)(1), unless the authorization is terminated or revoked sooner.    Influenza A by PCR NEGATIVE NEGATIVE Final   Influenza B by PCR NEGATIVE NEGATIVE Final    Comment: (NOTE) The Xpert Xpress SARS-CoV-2/FLU/RSV assay is intended as an aid in  the diagnosis of influenza from Nasopharyngeal swab specimens and  should not be used as a sole basis for treatment. Nasal washings and  aspirates are unacceptable for Xpert Xpress SARS-CoV-2/FLU/RSV  testing. Fact Sheet for Patients: PinkCheek.be Fact Sheet for Healthcare Providers: GravelBags.it This test is not yet approved or cleared by the [place] FDA and  has been authorized for detection and/or diagnosis of SARS-CoV-2 by  FDA under an Emergency Use Authorization (EUA). This EUA will remain  in effect (meaning this test can be used) for the duration of the  Covid-19 declaration under Section 564(b)(1) of the Act,  21  U.S.C. section 360bbb-3(b)(1), unless the authorization is  terminated or revoked. Performed at [facility], [address] [place], [place] [address]          Radiology Studies: CT ABDOMEN PELVIS WO CONTRAST  Result Date: 03/30/2019 CLINICAL DATA:  Low back pain. Fall. EXAM: CT ABDOMEN AND PELVIS WITHOUT CONTRAST TECHNIQUE: Multidetector CT imaging of the abdomen and pelvis was performed following the standard protocol without IV contrast. COMPARISON:  05/01/2017 FINDINGS: LOWER CHEST: No basilar pleural or apical pericardial effusion. HEPATOBILIARY: Normal hepatic contours. No intra- or extrahepatic biliary dilatation. Normal gallbladder. PANCREAS: Normal pancreas. No ductal dilatation or peripancreatic fluid collection. SPLEEN: Normal. ADRENALS/URINARY TRACT: --Adrenal glands: Normal.  --Kidneys and ureters: No hydronephrosis, nephroureterolithiasis or solid renal mass. --Urinary bladder: Normal for degree of distention STOMACH/BOWEL: --Stomach/Duodenum: No hiatal hernia. Normal duodenal course and caliber. --Small bowel: No dilatation or inflammation. --Colon: No focal abnormality. --Appendix: Normal. VASCULAR/LYMPHATIC: Normal course and caliber of the major abdominal vessels. No abdominal or pelvic lymphadenopathy. REPRODUCTIVE: There is a T-shaped contraceptive device within the uterus. MUSCULOSKELETAL. No bony spinal canal stenosis or focal osseous abnormality. OTHER: None. IMPRESSION: No acute abdominopelvic abnormality. Electronically Signed   By: Deatra Robinson M.D.   On: 03/30/2019 23:26   MR LUMBAR SPINE WO CONTRAST  Result Date: 03/31/2019 CLINICAL DATA:  Lower back pain, inability to move right leg EXAM: MRI LUMBAR SPINE WITHOUT CONTRAST TECHNIQUE: Multiplanar, multisequence MR imaging of the lumbar spine was performed. No intravenous contrast was administered. COMPARISON:  CT same day FINDINGS: Segmentation: There are 5 non-rib bearing lumbar type vertebral bodies with the last intervertebral disc space labeled as L5-S1. Alignment:  Normal Vertebrae: The vertebral body heights are well maintained. No fracture, marrow edema,or pathologic marrow infiltration. Conus medullaris and cauda equina: Conus extends to the L1 level. Conus and cauda equina appear normal. Paraspinal and other soft tissues: The paraspinal soft tissues and visualized retroperitoneal structures are unremarkable. The sacroiliac joints are intact. Disc levels: T12-L1:  No significant canal or neural foraminal narrowing. L1-L2:   No significant canal or neural foraminal narrowing. L2-L3:   No significant canal or neural foraminal narrowing. L3-L4:   No significant canal or neural foraminal narrowing. L4-L5: There is a broad-based disc bulge with a small posterior annular fissure present. No significant canal or  neural foraminal narrowing is seen. L5-S1: There is a broad-based disc bulge and facet arthrosis which causes mild bilateral neural foraminal narrowing. IMPRESSION: Mild lower lumbar spine spondylosis most notable at L4-L5 with a posterior annular fissure. Electronically Signed   By: Jonna Clark M.D.   On: 03/31/2019 02:45   DG Pelvis Portable  Result Date: 03/30/2019 CLINICAL DATA:  Fall, severe left hip pain, left leg neuro deficit EXAM: PORTABLE PELVIS 1-2 VIEWS COMPARISON:  None. FINDINGS: Bones of the pelvis are intact. No abnormal diastatic widening of the symphysis pubis or SI joints. Femoral heads are normally located. No proximal femoral fractures are identified. There are 2 radiopaque IUDs projecting over the level of the pelvis. Remaining soft tissues are unremarkable. IMPRESSION: No acute traumatic findings in the pelvis. 2 IUDs visualized in the pelvis.  Correlate with procedural history. Electronically Signed   By: Kreg Shropshire M.D.   On: 03/30/2019 22:57   CT L-SPINE NO CHARGE  Result Date: 03/30/2019 CLINICAL DATA:  Fall EXAM: CT LUMBAR SPINE WITHOUT CONTRAST TECHNIQUE: Multidetector CT imaging of the lumbar spine was performed without intravenous contrast administration. Multiplanar CT image reconstructions were also generated.  COMPARISON:  None. FINDINGS: Segmentation: 5 lumbar type vertebrae. Alignment: Normal. Vertebrae: No acute fracture or focal pathologic process. Paraspinal and other soft tissues: Negative. Disc levels: No spinal canal stenosis. IMPRESSION: No acute fracture or static subluxation of the lumbar spine. Electronically Signed   By: Deatra Robinson M.D.   On: 03/30/2019 23:22   DG Chest Portable 1 View  Result Date: 03/30/2019 CLINICAL DATA:  Fall EXAM: PORTABLE CHEST 1 VIEW COMPARISON:  None. FINDINGS: No consolidation, features of edema, pneumothorax, or effusion. Pulmonary vascularity is normally distributed. The cardiomediastinal contours are unremarkable. No  acute osseous or soft tissue abnormality. Telemetry leads overlie the chest. IMPRESSION: No acute cardiopulmonary abnormality. Electronically Signed   By: Kreg Shropshire M.D.   On: 03/30/2019 23:00      Scheduled Meds: . ARIPiprazole  10 mg Oral QHS  . Chlorhexidine Gluconate Cloth  6 each Topical Daily  . enoxaparin (LOVENOX) injection  40 mg Subcutaneous Q24H  . escitalopram  10 mg Oral Daily  . ketorolac  30 mg Intravenous Q6H   Continuous Infusions: . methocarbamol (ROBAXIN) IV       LOS: 0 days      Calvert Cantor, MD [facility] Pager: [email or web address] Password [email or web address] 03/31/2019, 1:45 PM

## 2019-03-31 NOTE — ED Provider Notes (Signed)
Patient signed out to me at shift change.  Patient had a fall and landed on her right knee.  She reports numbness in her right lower extremity and weakness.  Signed out pending MRI.  MRI shows disc bulge at L4/L5, but no nerve impingement.  Patient seen by discussed with Dr. Cheral Marker.    Bedside neuro exam shows diminished reflexes diffusely, decreased strength in right lower extremity compared to left, however she is noted to be able to lift the leg while repositioning on the bed, she also flexes at the knee to pull herself forward to the edge of the bed, but struggles to perform these motions during formal strength testing.  Case discussed with Dr. 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  Appreciate Dr. Marlowe Sax for admitting and Dr. Cheral Marker for formal consult.     Montine Circle, PA-C 03/31/19 Wilford Grist, MD 03/31/19 [phone]

## 2019-03-31 NOTE — H&P (Signed)
History and Physical    [name]:[id number] DOB: [birth year] DOA: 03/30/2019  PCP: Patient, No Pcp Per Patient coming from: Home  Chief Complaint: Fall  HPI: Jill Daniel is a 28 y.o. female with medical history significant of bipolar 1 disorder, seizures, anxiety, anemia presenting to the ED for evaluation after a fall.  Patient states she was carrying her daughter down the steps and all of a sudden her knees gave out and she fell.  Denies any head injury from the fall.  No loss of consciousness.  She fell down the last few steps and landed on her right hip.  States since the fall she is having tingling and numbness of her entire right leg from the hip down.  She has not been able to move this leg at all.  She is also having pain in her right hip and lower back.  No other complaints.  ED Course: Hemodynamically stable.  CBC and CMP unremarkable.  SARS-CoV-2 PCR test negative.  Beta-hCG negative.  Pelvic x-ray showing no acute traumatic findings.  Chest x-ray showing no acute cardiopulmonary abnormality.  CT abdomen pelvis without contrast showing no acute intraabdominopelvic abnormality.  CT lumbar spine showing no acute fracture or static subluxation of the lumbar spine.  MRI of lumbar spine showing mild lower lumbar spine spondylosis most notable at L4-L5 with posterior annular fissure.  Patient refused rectal exam.  Neurology was consulted.  Review of Systems:  All systems reviewed and apart from history of presenting illness, are negative.  Past Medical History:  Diagnosis Date   Anemia    Anxiety    Bipolar 1 disorder (HCC)    Seizures (HCC)     Past Surgical History:  Procedure Laterality Date   ADENOIDECTOMY     tailbone     TONSILLECTOMY     WISDOM TOOTH EXTRACTION       reports that she has quit smoking. She smoked 0.00 packs per day. She has never used smokeless tobacco. She reports that she does not drink alcohol or use drugs.  Allergies  Allergen  Reactions   Decadron [Dexamethasone] Other (See Comments)    "It interacts bad with my bipolar and makes me go crazy" = Psychosis   Grapefruit Extract Other (See Comments)    Cannot take with latuda   Tape Rash    NO PLASTIC TAPE, PLEASE!! (Paper tape preferred)    Family History  Problem Relation Age of Onset   Depression Mother    Depression Sister     Prior to Admission medications   Medication Sig Start Date End Date Taking? Authorizing Provider  ARIPiprazole (ABILIFY) 10 MG tablet Take 10 mg by mouth at bedtime. 11/17/18  Yes [provider]  escitalopram (LEXAPRO) 10 MG tablet Take 10 mg by mouth daily. 02/06/19  Yes [provider]    Physical Exam: Vitals:   03/31/19 0430 03/31/19 0445 03/31/19 0500 03/31/19 0515  BP: 105/71 108/70 104/71 103/71  Pulse: 63   63  Resp:      Temp:      TempSrc:      SpO2: 96%   96%    Physical Exam  Constitutional: She is oriented to person, place, and time. She appears well-developed and well-nourished. No distress.  HENT:  Head: Normocephalic.  Eyes: Right eye exhibits no discharge. Left eye exhibits no discharge.  Cardiovascular: Normal rate, regular rhythm and intact distal pulses.  Pulmonary/Chest: Effort normal and breath sounds normal. No respiratory distress. She  has no wheezes. She has no rales.  Abdominal: Soft. Bowel sounds are normal. She exhibits no distension. There is no abdominal tenderness. There is no guarding.  Musculoskeletal:        General: No edema.     Cervical back: Neck supple.  Neurological: She is alert and oriented to person, place, and time.  Left lower extremity with 5 out of 5 strength Patient is not able to raise her right lower extremity against gravity.  Strength 0 out of 5 when asked to plantarflex the right foot. No sensation to light touch in the entire right lower extremity. Sensation to light touch intact in the left lower extremity. Strength 5 out of 5 and sensation to  light touch intact in bilateral upper extremities. Hoover sign present: There is involuntary extension of the right leg when patient is asked to flex the contralateral leg against resistance  Skin: Skin is warm and dry. She is not diaphoretic.     Labs on Admission: I have personally reviewed following labs and imaging studies  CBC: Recent Labs  Lab 03/30/19 2116  WBC 8.1  NEUTROABS 4.3  HGB 13.2  HCT 41.7  MCV 86.3  PLT 336   Basic Metabolic Panel: Recent Labs  Lab 03/30/19 2116 03/30/19 2140  NA 140  --   K 4.0  --   CL 104  --   CO2 27  --   GLUCOSE 97  --   BUN 7  --   CREATININE 0.84 0.80  CALCIUM 9.3  --    GFR: CrCl cannot be calculated (Unknown ideal weight.). Liver Function Tests: Recent Labs  Lab 03/30/19 2116  AST 18  ALT 18  ALKPHOS 65  BILITOT 0.3  PROT 6.6  ALBUMIN 3.8   No results for input(s): LIPASE, AMYLASE in the last 168 hours. No results for input(s): AMMONIA in the last 168 hours. Coagulation Profile: No results for input(s): INR, PROTIME in the last 168 hours. Cardiac Enzymes: No results for input(s): CKTOTAL, CKMB, CKMBINDEX, TROPONINI in the last 168 hours. BNP (last 3 results) No results for input(s): PROBNP in the last 8760 hours. HbA1C: No results for input(s): HGBA1C in the last 72 hours. CBG: No results for input(s): GLUCAP in the last 168 hours. Lipid Profile: No results for input(s): CHOL, HDL, LDLCALC, TRIG, CHOLHDL, LDLDIRECT in the last 72 hours. Thyroid Function Tests: No results for input(s): TSH, T4TOTAL, FREET4, T3FREE, THYROIDAB in the last 72 hours. Anemia Panel: No results for input(s): VITAMINB12, FOLATE, FERRITIN, TIBC, IRON, RETICCTPCT in the last 72 hours. Urine analysis:    Component Value Date/Time   COLORURINE YELLOW (A) 02/12/2018 1542   APPEARANCEUR HAZY (A) 02/12/2018 1542   LABSPEC 1.006 02/12/2018 1542   PHURINE 6.0 02/12/2018 1542   GLUCOSEU NEGATIVE 02/12/2018 1542   HGBUR NEGATIVE  02/12/2018 1542   BILIRUBINUR NEGATIVE 02/12/2018 1542   KETONESUR NEGATIVE 02/12/2018 1542   PROTEINUR NEGATIVE 02/12/2018 1542   NITRITE NEGATIVE 02/12/2018 1542   LEUKOCYTESUR NEGATIVE 02/12/2018 1542    Radiological Exams on Admission: CT ABDOMEN PELVIS WO CONTRAST  Result Date: 03/30/2019 CLINICAL DATA:  Low back pain. Fall. EXAM: CT ABDOMEN AND PELVIS WITHOUT CONTRAST TECHNIQUE: Multidetector CT imaging of the abdomen and pelvis was performed following the standard protocol without IV contrast. COMPARISON:  05/01/2017 FINDINGS: LOWER CHEST: No basilar pleural or apical pericardial effusion. HEPATOBILIARY: Normal hepatic contours. No intra- or extrahepatic biliary dilatation. Normal gallbladder. PANCREAS: Normal pancreas. No ductal dilatation or peripancreatic fluid collection.  SPLEEN: Normal. ADRENALS/URINARY TRACT: --Adrenal glands: Normal. --Kidneys and ureters: No hydronephrosis, nephroureterolithiasis or solid renal mass. --Urinary bladder: Normal for degree of distention STOMACH/BOWEL: --Stomach/Duodenum: No hiatal hernia. Normal duodenal course and caliber. --Small bowel: No dilatation or inflammation. --Colon: No focal abnormality. --Appendix: Normal. VASCULAR/LYMPHATIC: Normal course and caliber of the major abdominal vessels. No abdominal or pelvic lymphadenopathy. REPRODUCTIVE: There is a T-shaped contraceptive device within the uterus. MUSCULOSKELETAL. No bony spinal canal stenosis or focal osseous abnormality. OTHER: None. IMPRESSION: No acute abdominopelvic abnormality. Electronically Signed   By: Ulyses Jarred M.D.   On: 03/30/2019 23:26   MR LUMBAR SPINE WO CONTRAST  Result Date: 03/31/2019 CLINICAL DATA:  Lower back pain, inability to move right leg EXAM: MRI LUMBAR SPINE WITHOUT CONTRAST TECHNIQUE: Multiplanar, multisequence MR imaging of the lumbar spine was performed. No intravenous contrast was administered. COMPARISON:  CT same day FINDINGS: Segmentation: There are 5  non-rib bearing lumbar type vertebral bodies with the last intervertebral disc space labeled as L5-S1. Alignment:  Normal Vertebrae: The vertebral body heights are well maintained. No fracture, marrow edema,or pathologic marrow infiltration. Conus medullaris and cauda equina: Conus extends to the L1 level. Conus and cauda equina appear normal. Paraspinal and other soft tissues: The paraspinal soft tissues and visualized retroperitoneal structures are unremarkable. The sacroiliac joints are intact. Disc levels: T12-L1:  No significant canal or neural foraminal narrowing. L1-L2:   No significant canal or neural foraminal narrowing. L2-L3:   No significant canal or neural foraminal narrowing. L3-L4:   No significant canal or neural foraminal narrowing. L4-L5: There is a broad-based disc bulge with a small posterior annular fissure present. No significant canal or neural foraminal narrowing is seen. L5-S1: There is a broad-based disc bulge and facet arthrosis which causes mild bilateral neural foraminal narrowing. IMPRESSION: Mild lower lumbar spine spondylosis most notable at L4-L5 with a posterior annular fissure. Electronically Signed   By: Prudencio Pair M.D.   On: 03/31/2019 02:45   DG Pelvis Portable  Result Date: 03/30/2019 CLINICAL DATA:  Fall, severe left hip pain, left leg neuro deficit EXAM: PORTABLE PELVIS 1-2 VIEWS COMPARISON:  None. FINDINGS: Bones of the pelvis are intact. No abnormal diastatic widening of the symphysis pubis or SI joints. Femoral heads are normally located. No proximal femoral fractures are identified. There are 2 radiopaque IUDs projecting over the level of the pelvis. Remaining soft tissues are unremarkable. IMPRESSION: No acute traumatic findings in the pelvis. 2 IUDs visualized in the pelvis.  Correlate with procedural history. Electronically Signed   By: Lovena Le M.D.   On: 03/30/2019 22:57   CT L-SPINE NO CHARGE  Result Date: 03/30/2019 CLINICAL DATA:  Fall EXAM: CT  LUMBAR SPINE WITHOUT CONTRAST TECHNIQUE: Multidetector CT imaging of the lumbar spine was performed without intravenous contrast administration. Multiplanar CT image reconstructions were also generated. COMPARISON:  None. FINDINGS: Segmentation: 5 lumbar type vertebrae. Alignment: Normal. Vertebrae: No acute fracture or focal pathologic process. Paraspinal and other soft tissues: Negative. Disc levels: No spinal canal stenosis. IMPRESSION: No acute fracture or static subluxation of the lumbar spine. Electronically Signed   By: Ulyses Jarred M.D.   On: 03/30/2019 23:22   DG Chest Portable 1 View  Result Date: 03/30/2019 CLINICAL DATA:  Fall EXAM: PORTABLE CHEST 1 VIEW COMPARISON:  None. FINDINGS: No consolidation, features of edema, pneumothorax, or effusion. Pulmonary vascularity is normally distributed. The cardiomediastinal contours are unremarkable. No acute osseous or soft tissue abnormality. Telemetry leads overlie the chest. IMPRESSION: No acute  cardiopulmonary abnormality. Electronically Signed   By: Kreg ShropshirePrice  DeHay M.D.   On: 03/30/2019 23:00    EKG: Independently reviewed.  Normal sinus rhythm.  Assessment/Plan Principal Problem:   Weakness of right lower extremity Active Problems:   Bipolar I disorder (HCC)   Anxiety   Numbness and tingling of right lower extremity   Encounter for screening for HIV   Right lower extremity weakness, numbness, and tingling Unclear exact etiology.  Patient had a fall at home and landed down the last few steps on her right hip.  She reports tingling and numbness of her entire right leg.  Reports inability to move her entire right leg from the hip down since the fall.  Reports pain in her right hip and lower back.  On exam, patient has no sensation in her entire right leg.  She is not able to raise this leg against gravity and is not able to plantar flex her foot.  However, does display involuntary extension of the right leg when she is asked to flex the  contralateral leg against resistance. Pelvic x-ray showing no acute traumatic findings. CT lumbar spine showing no acute fracture or static subluxation of the lumbar spine.  MRI of lumbar spine showing mild lower lumbar spine spondylosis most notable at L4-L5 with posterior annular fissure. -Neurology has been consulted by ED provider, appreciate recommendations -PT and OT evaluation -Fall precautions -Tylenol as needed for pain  Bipolar 1 disorder, anxiety -Continue home Abilify, Lexapro  HIV screening The patient falls between the ages of 13-64 and should be screened for HIV, therefore HIV testing ordered.  DVT prophylaxis: Lovenox Code Status: Full code Family Communication: No family available at this time. Disposition Plan: Anticipate discharge after clinical improvement. Consults called: Neurology (Dr. Otelia LimesLindzen) Admission status: It is my clinical opinion that referral for OBSERVATION is reasonable and necessary in this patient based on the above information provided. The aforementioned taken together are felt to place the patient at high risk for further clinical deterioration. However it is anticipated that the patient may be medically stable for discharge from the hospital within 24 to 48 hours.  The medical decision making on this patient was of high complexity and the patient is at high risk for clinical deterioration, therefore this is a level 3 visit.  John GiovanniVasundhra Melanee Cordial MD [facility] Pager [phone]  If 7PM-7AM, please contact night-coverage [email or web address] Password [email or web address]  03/31/2019, 5:36 AM

## 2019-03-31 NOTE — ED Provider Notes (Signed)
Medical screening examination/treatment/procedure(s) were conducted as a shared visit with non-physician practitioner(s) and myself.  I personally evaluated the patient during the encounter. Briefly, the patient is a 28 y.o. female with history of bipolar, anxiety, seizures who presents to the ED with right lower leg pain after a fall.  Patient with normal vitals.  No fever.  Patient fell while going down stairs while carrying her child.  She states that she fell on her buttocks.  She has had excruciating pain in her low back that is making it difficult for her to move her right leg.  She states that she does not feel anything in her right leg.  This is both to light sensation to pinprick throughout the entire right lower leg from the hip down.  She does not appear to have the ability to move her leg however when I lift her leg up in the air she appeared to be able to hold her leg up for several seconds.  She has good patellar reflex.  She appears to have mostly paraspinal lumbar tenderness on the right.  She has good sensation and strength above the waist.  She denies any urinary retention.  She refuses rectal exam.  Overall, examination is difficult.  She has signs concerning for disc herniation or cord compression.  However it appears there could be a functional component.  Lab work is overall unremarkable.  She has been evaluated with a CT scan of her abdomen and pelvis, lumbar spine and there are no acute injuries.  Awaiting MRI to further evaluate for lumbar injury.  Pain medications have been given.  Possibly a cord injury although less likely given history and physical.  Could be a bad muscle spasm as well.  Will need reevaluation after MRI.  This chart was dictated using voice recognition software.  Despite best efforts to proofread,  errors can occur which can change the documentation meaning.     EKG Interpretation  Date/Time:  Saturday March 30 2019 21:24:31 EST Ventricular Rate:  72 PR  Interval:    QRS Duration: 97 QT Interval:  374 QTC Calculation: 410 R Axis:   9 Text Interpretation: Sinus rhythm Probable left ventricular hypertrophy Confirmed by Lennice Sites [id number]) on 03/30/2019 10:01:59 PM           Lennice Sites, DO 03/31/19 0116

## 2019-03-31 NOTE — Evaluation (Signed)
Physical Therapy Evaluation Patient Details Name: Jill Daniel MRN: [id number] DOB: 05/11/1990 Today's Date: 03/31/2019   History of Present Illness  Jill Daniel is an 28 y.o. female with bipolar I disorder, seizures anemia and anxiety who presented to the ED from home via EMS with c/c of a fall Saturday evening secondary to right knee giveway weakness. She fell down 4 stairs and landed on her knees. She did not hit her head or experience LOC. She was unable to get up and ambulate after the fall. On arrival to the Ed shw was complaining of RLE sensory numbness, paresthesias and weakness extending down her thigh and leg from her hip. Also with back and right hip pain, with pain also extending down her leg. Inconsistencies in Neuro exam, including positive Hoover sign, possible nonorganic etiology of RLE weakness  Clinical Impression   Pt admitted with above diagnosis. Independent prior to admission; Presents to PT with RLE weakness, slow moving, but able to get up and walk to bathroom with use of RW; Strength is returning, with observed active movement of RLE, and ability to get up and walk to bathroom with use of RW; I anticipate good functional recovery; Pt currently with functional limitations due to the deficits listed below (see PT Problem List). Pt will benefit from skilled PT to increase their independence and safety with mobility to allow discharge to the venue listed below.       Follow Up Recommendations Outpatient PT    Equipment Recommendations  Rolling walker with 5" wheels;3in1 (PT)    Recommendations for Other Services       Precautions / Restrictions Precautions Precautions: Fall      Mobility  Bed Mobility Overal bed mobility: Needs Assistance Bed Mobility: Supine to Sit     Supine to sit: Min assist     General bed mobility comments: Min handheld assist to pull to sit  Transfers Overall transfer level: Needs assistance Equipment used: Rolling walker  (2 wheeled) Transfers: Sit to/from Stand Sit to Stand: Min guard         General transfer comment: Good initiation and slow rise  Ambulation/Gait Ambulation/Gait assistance: Min guard Gait Distance (Feet): 20 Feet(to and from the bathroom) Assistive device: Rolling walker (2 wheeled) Gait Pattern/deviations: Step-through pattern;Decreased step length - right;Decreased step length - left;Decreased stride length Gait velocity: slow   General Gait Details: Used RW to support self; Painful RLE, specifically hip, in stance; 3 episodes of RLE buckle during walk, able to suport herself on RW  Stairs            Wheelchair Mobility    Modified Rankin (Stroke Patients Only)       Balance                                             Pertinent Vitals/Pain Pain Assessment: 0-10 Pain Score: 7  Pain Location: R Hip Pain Descriptors / Indicators: Aching;Grimacing;Guarding Pain Intervention(s): Monitored during session;Other (comment)(Requesting muscle relaxer; RNnotified)    Home Living Family/patient expects to be discharged to:: Private residence Living Arrangements: Other relatives Available Help at Discharge: Family Type of Home: House Home Access: Stairs to enter Entrance Stairs-Rails: Psychiatric nurse of Steps: 3 Home Layout: Two level;Bed/bath upstairs Home Equipment: None      Prior Function Level of Independence: Independent  Comments: Daughter turned 1 on 12/7     Hand Dominance        Extremity/Trunk Assessment   Upper Extremity Assessment Upper Extremity Assessment: Overall WFL for tasks assessed    Lower Extremity Assessment Lower Extremity Assessment: RLE deficits/detail RLE Deficits / Details: Able to actively flex and extend the hip and knee in bed; active ankel dorsiflexion/plantarflexion as well; initiates knee extension against gravity; noted 3 episodes of R knee buckling in stance, able to hold  self up with RW support       Communication   Communication: No difficulties  Cognition Arousal/Alertness: Awake/alert Behavior During Therapy: Flat affect Overall Cognitive Status: Within Functional Limits for tasks assessed                                        General Comments      Exercises     Assessment/Plan    PT Assessment Patient needs continued PT services  PT Problem List Decreased strength;Decreased range of motion;Decreased activity tolerance;Decreased balance;Decreased mobility;Decreased coordination;Decreased knowledge of use of DME;Decreased safety awareness;Decreased knowledge of precautions;Pain       PT Treatment Interventions DME instruction;Gait training;Stair training;Functional mobility training;Therapeutic activities;Therapeutic exercise;Balance training;Patient/family education    PT Goals (Current goals can be found in the Care Plan section)  Acute Rehab PT Goals Patient Stated Goal: hopes to be home tomorrow PT Goal Formulation: With patient Time For Goal Achievement: 04/14/19 Potential to Achieve Goals: Good    Frequency Min 3X/week   Barriers to discharge        Co-evaluation               AM-PAC PT "6 Clicks" Mobility  Outcome Measure Help needed turning from your back to your side while in a flat bed without using bedrails?: None Help needed moving from lying on your back to sitting on the side of a flat bed without using bedrails?: A Little Help needed moving to and from a bed to a chair (including a wheelchair)?: A Little Help needed standing up from a chair using your arms (e.g., wheelchair or bedside chair)?: A Little Help needed to walk in hospital room?: A Little Help needed climbing 3-5 steps with a railing? : A Little 6 Click Score: 19    End of Session Equipment Utilized During Treatment: Gait belt Activity Tolerance: Patient tolerated treatment well Patient left: in bed;with call bell/phone within  reach;with family/visitor present Nurse Communication: Mobility status PT Visit Diagnosis: Unsteadiness on feet (R26.81);Other abnormalities of gait and mobility (R26.89);Muscle weakness (generalized) (M62.81)    Time: 1791-5056 PT Time Calculation (min) (ACUTE ONLY): 21 min   Charges:   PT Evaluation $PT Eval Low Complexity: 1 Low          Van Clines, PT  Acute Rehabilitation Services Pager [phone] Office [phone]   Levi Aland 03/31/2019, 2:06 PM

## 2019-03-31 NOTE — Consult Note (Signed)
NEURO HOSPITALIST CONSULT NOTE   Requesting physician: Dr. Loney Loh  Reason for Consult: RLE weakness  History obtained from:   Patient and Chart    HPI:                                                                                                                                          Jill Daniel is an 28 y.o. female with bipolar I disorder, seizures anemia and anxiety who presented to the ED from home via EMS with c/c of a fall Saturday evening secondary to right knee giveway weakness. She fell down 4 stairs and landed on her knees. She did not hit her head or experience LOC. She was unable to get up and ambulate after the fall. On arrival to the Ed shw was complaining of RLE sensory numbness, paresthesias and weakness extending down her thigh and leg from her hip. Also with back and right hip pain, with pain also extending down her leg. 100 mcg fentanyl was administered by EMS. She denied any other symptoms.   Past Medical History:  Diagnosis Date  . Anemia   . Anxiety   . Bipolar 1 disorder (HCC)   . Seizures (HCC)     Past Surgical History:  Procedure Laterality Date  . ADENOIDECTOMY    . tailbone    . TONSILLECTOMY    . WISDOM TOOTH EXTRACTION      Family History  Problem Relation Age of Onset  . Depression Mother   . Depression Sister               Social History:  reports that she has quit smoking. She smoked 0.00 packs per day. She has never used smokeless tobacco. She reports that she does not drink alcohol or use drugs.  Allergies  Allergen Reactions  . Decadron [Dexamethasone] Other (See Comments)    "It interacts bad with my bipolar and makes me go crazy" = Psychosis  . Grapefruit Extract Other (See Comments)    Cannot take with latuda  . Tape Rash    NO PLASTIC TAPE, PLEASE!! (Paper tape preferred)    MEDICATIONS:  No current facility-administered medications on file prior to encounter.   Current Outpatient Medications on File Prior to Encounter  Medication Sig Dispense Refill  . ARIPiprazole (ABILIFY) 10 MG tablet Take 10 mg by mouth at bedtime.    Marland Kitchen. escitalopram (LEXAPRO) 10 MG tablet Take 10 mg by mouth daily.       ROS:                                                                                                                                       As per HPI. Comprehensive ROS otherwise negative.    Blood pressure 102/61, pulse 61, temperature 98.4 F (36.9 C), temperature source Oral, resp. rate 17, SpO2 96 %, unknown if currently breastfeeding.   General Examination:                                                                                                       Physical Exam  HEENT-  /AT   Lungs- Respirations unlabored Extremities- No edema  Neurological Examination Mental Status: Awake and alert. No aphasia noted. Able to follow all commands.  Cranial Nerves: II: Pupils equal. Fixates and tracks normally.   III,IV, VI: No ptosis. Eyes conjugate without forced gaze deviation.  V,VII: Face symmetric. Temp sensation equal bilaterally.  VIII: hearing intact to voice IX,X: Palate rises symmetrically XI: Symmetric XII: midline tongue extension Motor: RUE and LUE 5/5 RLE with no movement to command initially. Subsequently, with repeated coaching there was some movement approximately 1 cm in abduction and adduction at hip. Also with coaching there was approximately 1 cm movement with leg extension and flexion at knee. No movement at ankle to command. Of note, when moving herself from a laying to a seated position on the edge of the bed, she was observed to hook her right heel prior to swinging her LLE over and also was observed to adduct at the hip with normal strength when swinging her RLE over to the seated position. Positive Hoover sign on the right as well.  LLE: 5/5 with  sudden giveway initially upon testing of Hoover's sign, then with full strength in conjunction with full-force contralateral hip extension (positive Hoover sign).  Tone is normal in RLE = LLE Sensory: Intact to temp and FT BUE and LLE. Apparent complete loss of FT and temp sensation to entire RLE.  Deep Tendon Reflexes:  1+ bilateral brachioradialis and biceps. Trace patellars bilaterally. Trace achilles bilaterally.  Plantars: Mute bilaterally  Cerebellar: No ataxia  noted with upper extremity movement.  Gait: Able to sit normally with good postural control. No listing to the right or left.    Lab Results: Basic Metabolic Panel: Recent Labs  Lab 03/30/19 2116 03/30/19 2140  NA 140  --   K 4.0  --   CL 104  --   CO2 27  --   GLUCOSE 97  --   BUN 7  --   CREATININE 0.84 0.80  CALCIUM 9.3  --     CBC: Recent Labs  Lab 03/30/19 2116  WBC 8.1  NEUTROABS 4.3  HGB 13.2  HCT 41.7  MCV 86.3  PLT 336    Cardiac Enzymes: No results for input(s): CKTOTAL, CKMB, CKMBINDEX, TROPONINI in the last 168 hours.  Lipid Panel: No results for input(s): CHOL, TRIG, HDL, CHOLHDL, VLDL, LDLCALC in the last 168 hours.  Imaging: CT ABDOMEN PELVIS WO CONTRAST  Result Date: 03/30/2019 CLINICAL DATA:  Low back pain. Fall. EXAM: CT ABDOMEN AND PELVIS WITHOUT CONTRAST TECHNIQUE: Multidetector CT imaging of the abdomen and pelvis was performed following the standard protocol without IV contrast. COMPARISON:  05/01/2017 FINDINGS: LOWER CHEST: No basilar pleural or apical pericardial effusion. HEPATOBILIARY: Normal hepatic contours. No intra- or extrahepatic biliary dilatation. Normal gallbladder. PANCREAS: Normal pancreas. No ductal dilatation or peripancreatic fluid collection. SPLEEN: Normal. ADRENALS/URINARY TRACT: --Adrenal glands: Normal. --Kidneys and ureters: No hydronephrosis, nephroureterolithiasis or solid renal mass. --Urinary bladder: Normal for degree of distention STOMACH/BOWEL:  --Stomach/Duodenum: No hiatal hernia. Normal duodenal course and caliber. --Small bowel: No dilatation or inflammation. --Colon: No focal abnormality. --Appendix: Normal. VASCULAR/LYMPHATIC: Normal course and caliber of the major abdominal vessels. No abdominal or pelvic lymphadenopathy. REPRODUCTIVE: There is a T-shaped contraceptive device within the uterus. MUSCULOSKELETAL. No bony spinal canal stenosis or focal osseous abnormality. OTHER: None. IMPRESSION: No acute abdominopelvic abnormality. Electronically Signed   By: Ulyses Jarred M.D.   On: 03/30/2019 23:26   MR LUMBAR SPINE WO CONTRAST  Result Date: 03/31/2019 CLINICAL DATA:  Lower back pain, inability to move right leg EXAM: MRI LUMBAR SPINE WITHOUT CONTRAST TECHNIQUE: Multiplanar, multisequence MR imaging of the lumbar spine was performed. No intravenous contrast was administered. COMPARISON:  CT same day FINDINGS: Segmentation: There are 5 non-rib bearing lumbar type vertebral bodies with the last intervertebral disc space labeled as L5-S1. Alignment:  Normal Vertebrae: The vertebral body heights are well maintained. No fracture, marrow edema,or pathologic marrow infiltration. Conus medullaris and cauda equina: Conus extends to the L1 level. Conus and cauda equina appear normal. Paraspinal and other soft tissues: The paraspinal soft tissues and visualized retroperitoneal structures are unremarkable. The sacroiliac joints are intact. Disc levels: T12-L1:  No significant canal or neural foraminal narrowing. L1-L2:   No significant canal or neural foraminal narrowing. L2-L3:   No significant canal or neural foraminal narrowing. L3-L4:   No significant canal or neural foraminal narrowing. L4-L5: There is a broad-based disc bulge with a small posterior annular fissure present. No significant canal or neural foraminal narrowing is seen. L5-S1: There is a broad-based disc bulge and facet arthrosis which causes mild bilateral neural foraminal narrowing.  IMPRESSION: Mild lower lumbar spine spondylosis most notable at L4-L5 with a posterior annular fissure. Electronically Signed   By: Prudencio Pair M.D.   On: 03/31/2019 02:45   DG Pelvis Portable  Result Date: 03/30/2019 CLINICAL DATA:  Fall, severe left hip pain, left leg neuro deficit EXAM: PORTABLE PELVIS 1-2 VIEWS COMPARISON:  None. FINDINGS: Bones of the pelvis are intact.  No abnormal diastatic widening of the symphysis pubis or SI joints. Femoral heads are normally located. No proximal femoral fractures are identified. There are 2 radiopaque IUDs projecting over the level of the pelvis. Remaining soft tissues are unremarkable. IMPRESSION: No acute traumatic findings in the pelvis. 2 IUDs visualized in the pelvis.  Correlate with procedural history. Electronically Signed   By: Kreg Shropshire M.D.   On: 03/30/2019 22:57   CT L-SPINE NO CHARGE  Result Date: 03/30/2019 CLINICAL DATA:  Fall EXAM: CT LUMBAR SPINE WITHOUT CONTRAST TECHNIQUE: Multidetector CT imaging of the lumbar spine was performed without intravenous contrast administration. Multiplanar CT image reconstructions were also generated. COMPARISON:  None. FINDINGS: Segmentation: 5 lumbar type vertebrae. Alignment: Normal. Vertebrae: No acute fracture or focal pathologic process. Paraspinal and other soft tissues: Negative. Disc levels: No spinal canal stenosis. IMPRESSION: No acute fracture or static subluxation of the lumbar spine. Electronically Signed   By: Deatra Robinson M.D.   On: 03/30/2019 23:22   DG Chest Portable 1 View  Result Date: 03/30/2019 CLINICAL DATA:  Fall EXAM: PORTABLE CHEST 1 VIEW COMPARISON:  None. FINDINGS: No consolidation, features of edema, pneumothorax, or effusion. Pulmonary vascularity is normally distributed. The cardiomediastinal contours are unremarkable. No acute osseous or soft tissue abnormality. Telemetry leads overlie the chest. IMPRESSION: No acute cardiopulmonary abnormality. Electronically Signed   By:  Kreg Shropshire M.D.   On: 03/30/2019 23:00    Assessment: 28 year old female with RLE weakness and numbness after a fall.  1. CT abdomen/pelvis: No acute abdominopelvic abnormality. 2. MRI lumbar spine: Mild lower lumbar spine spondylosis most notable at L4-L5 with a posterior annular fissure.  3. Exam findings show inconsistencies, coachability and positive Hoover sign on the right, suggestive of nonorganic etiology for her weakness. No multilevel radiculopathy, conus medullaris lesion or psoas hematoma or other mass to account for her RLE weakness and sensory loss.   Recommendations: 1. PT and OT.  2. Continue to monitor.  3. Consider psychology consult to evaluate for possible life stressors.     Electronically signed: Dr. Caryl Pina 03/31/2019, 6:34 AM

## 2019-03-31 NOTE — Progress Notes (Signed)
Was called to the patient's room regarding the concern with delay of muscle spasms medication (Robaxin) prn per the patient's mother. The Primary nurse had attempted to explain the delay.    Pharmacy was notified of the patient's family concerns.  The Pharmacy did acknowledge the delay in the medication andwas very apologetic and explained that the IV Robaxin is not kept in stock on the medical floors.  The doses of the medication was changed also which caused a delay.  After talking with the mother of the patient and explaining the situation, she verbalized understanding and was appreciative of the explanation.  She was also made aware that the medications ordered for pain, presently is available in the Pyxis and there should no longer be a delay.

## 2019-04-01 DIAGNOSIS — Z114 Encounter for screening for human immunodeficiency virus [HIV]: Secondary | ICD-10-CM | POA: Diagnosis not present

## 2019-04-01 DIAGNOSIS — R202 Paresthesia of skin: Secondary | ICD-10-CM

## 2019-04-01 DIAGNOSIS — R200 Anesthesia of skin: Secondary | ICD-10-CM | POA: Diagnosis not present

## 2019-04-01 DIAGNOSIS — F319 Bipolar disorder, unspecified: Secondary | ICD-10-CM | POA: Diagnosis not present

## 2019-04-01 MED ORDER — METHOCARBAMOL 500 MG PO TABS: 1000.0000 mg | ORAL_TABLET | Freq: Three times a day (TID) | ORAL | 0 refills | Status: DC | PRN

## 2019-04-01 MED ORDER — NAPROXEN 500 MG PO TABS: 500.0000 mg | ORAL_TABLET | Freq: Two times a day (BID) | ORAL | 0 refills | Status: DC

## 2019-04-01 MED ORDER — ACETAMINOPHEN 325 MG PO TABS: 650.0000 mg | ORAL_TABLET | Freq: Four times a day (QID) | ORAL | Status: DC | PRN

## 2019-04-01 MED ORDER — UNABLE TO FIND: 0 refills | Status: AC

## 2019-04-01 MED ORDER — OXYCODONE HCL 5 MG PO TABS: 5.0000 mg | ORAL_TABLET | ORAL | 0 refills | Status: DC | PRN

## 2019-04-01 MED FILL — NAPROXEN 500 MG TABLET: 500 | 15 days supply | Qty: 30 | Fill #0

## 2019-04-01 MED FILL — oxyCODONE HCL 5 MG TABS: 5 | 4 days supply | Qty: 30 | Fill #0

## 2019-04-01 MED FILL — METHOCARBAMOL 500 MG TABS: 500 | 15 days supply | Qty: 90 | Fill #0

## 2019-04-01 NOTE — Discharge Summary (Signed)
Physician Discharge Summary  Jill Daniel:[id number] DOB: September 13, 1990 DOA: 03/30/2019  PCP: Patient, No Pcp Per  Admit date: 03/30/2019 Discharge date: 04/01/2019  Admitted From: home  Disposition:  home   Recommendations for Outpatient Follow-up:  1. outpt PT ordered  Discharge Condition:  stable   CODE STATUS:  Full code   Diet recommendation:  Regular diet Consultations:  Neuro     Discharge Diagnoses:  Principal Problem:   Weakness of right lower extremity due to trauma Active Problems:   Bipolar I disorder (HCC)   Anxiety   Numbness and tingling of right lower extremity    Brief Summary: Jill Daniel is a 28 y.o.femalewith medical history significant of bipolar 1 disorder, seizures, anxiety, anemia presenting to the ED for evaluation after a fall. Patient states she was carrying her daughter down the steps and all of a sudden her knees gave out and she fell. Denies any head injury from the fall. No loss of consciousness. She fell down the last few steps and landed on her right hip. States since the fall she is having tingling and numbness of her entire right leg from the hip down. She has not been able to move this leg at all. She is also having pain in her right hip and lower back. No other complaints.   Hospital Course:  Principal Problem:   Pain and weakness in right back and leg - due to fall down the stairs- MRI L spine and CT spine unremarkable - starting Toradol and Robaxin- have asked RN to alternate heat and ice.   Active Problems:   Bipolar I disorder    Anxiety - cont Abilify, Lexapro    Discharge Exam: Vitals:   04/01/19 0511 04/01/19 1313  BP: 100/67 114/66  Pulse: 71 (!) 57  Resp:  19  Temp: 98.3 F (36.8 C) 97.9 F (36.6 C)  SpO2: 98% 97%   Vitals:   03/31/19 1531 03/31/19 2035 04/01/19 0511 04/01/19 1313  BP: 115/79 119/77 100/67 114/66  Pulse: 65 62 71 (!) 57  Resp: 16   19  Temp: 98.7 F (37.1 C) 98.5 F  (36.9 C) 98.3 F (36.8 C) 97.9 F (36.6 C)  TempSrc: Oral Oral Oral Oral  SpO2:  98% 98% 97%    General: Pt is alert, awake, not in acute distress Cardiovascular: RRR, S1/S2 +, no rubs, no gallops Respiratory: CTA bilaterally, no wheezing, no rhonchi Abdominal: Soft, NT, ND, bowel sounds + Extremities: no edema, no cyanosis   Discharge Instructions  Discharge Instructions    Ambulatory referral to Physical Therapy   Complete by: As directed    Patient establishing [facility] at [facility]   Iontophoresis - 4 mg/ml of dexamethasone: No   T.E.N.S. Unit Evaluation and Dispense as Indicated: No   Diet - low sodium heart healthy   Complete by: As directed    Increase activity slowly   Complete by: As directed      Allergies as of 04/01/2019      Reactions   Decadron [dexamethasone] Other (See Comments)   "It interacts bad with my bipolar and makes me go crazy" = Psychosis   Grapefruit Extract Other (See Comments)   Cannot take with latuda   Tape Rash   NO PLASTIC TAPE, PLEASE!! (Paper tape preferred)      Medication List    TAKE these medications   acetaminophen 325 MG tablet Commonly known as: TYLENOL Take 2 tablets (650 mg total) by mouth every 6 (  six) hours as needed for mild pain (or Fever >/= 101).   ARIPiprazole 10 MG tablet Commonly known as: ABILIFY Take 10 mg by mouth at bedtime.   escitalopram 10 MG tablet Commonly known as: LEXAPRO Take 10 mg by mouth daily.   methocarbamol 500 MG tablet Commonly known as: Robaxin Take 2 tablets (1,000 mg total) by mouth every 8 (eight) hours as needed for muscle spasms.   naproxen 500 MG tablet Commonly known as: Naprosyn Take 1 tablet (500 mg total) by mouth 2 (two) times daily with a meal.   oxyCODONE 5 MG immediate release tablet Commonly known as: Oxy IR/ROXICODONE Take 1-2 tablets (5-10 mg total) by mouth every 4 (four) hours as needed for moderate pain or severe pain.   UNABLE TO FIND Out patient physical  therapy            Durable Medical Equipment  (From admission, onward)         Start     Ordered   04/01/19 1011  For home use only DME 3 n 1  Once     04/01/19 1011   04/01/19 1011  For home use only DME Walker rolling  Once    Comments: Rolling walker with 5" wheels  Question:  Patient needs a walker to treat with the following condition  Answer:  Mobility impaired   04/01/19 1011         Follow-up Information    [facility] Schedule an appointment as soon as possible for a visit.   Specialty: Rehabilitation Contact information: [address] [id number] mc [place] [address] [phone]         Allergies  Allergen Reactions  . Decadron [Dexamethasone] Other (See Comments)    "It interacts bad with my bipolar and makes me go crazy" = Psychosis  . Grapefruit Extract Other (See Comments)    Cannot take with latuda  . Tape Rash    NO PLASTIC TAPE, PLEASE!! (Paper tape preferred)     Procedures/Studies:    CT ABDOMEN PELVIS WO CONTRAST  Result Date: 03/30/2019 CLINICAL DATA:  Low back pain. Fall. EXAM: CT ABDOMEN AND PELVIS WITHOUT CONTRAST TECHNIQUE: Multidetector CT imaging of the abdomen and pelvis was performed following the standard protocol without IV contrast. COMPARISON:  05/01/2017 FINDINGS: LOWER CHEST: No basilar pleural or apical pericardial effusion. HEPATOBILIARY: Normal hepatic contours. No intra- or extrahepatic biliary dilatation. Normal gallbladder. PANCREAS: Normal pancreas. No ductal dilatation or peripancreatic fluid collection. SPLEEN: Normal. ADRENALS/URINARY TRACT: --Adrenal glands: Normal. --Kidneys and ureters: No hydronephrosis, nephroureterolithiasis or solid renal mass. --Urinary bladder: Normal for degree of distention STOMACH/BOWEL: --Stomach/Duodenum: No hiatal hernia. Normal duodenal course and caliber. --Small bowel: No dilatation or inflammation. --Colon: No focal  abnormality. --Appendix: Normal. VASCULAR/LYMPHATIC: Normal course and caliber of the major abdominal vessels. No abdominal or pelvic lymphadenopathy. REPRODUCTIVE: There is a T-shaped contraceptive device within the uterus. MUSCULOSKELETAL. No bony spinal canal stenosis or focal osseous abnormality. OTHER: None. IMPRESSION: No acute abdominopelvic abnormality. Electronically Signed   By: Deatra Robinson M.D.   On: 03/30/2019 23:26   MR LUMBAR SPINE WO CONTRAST  Result Date: 03/31/2019 CLINICAL DATA:  Lower back pain, inability to move right leg EXAM: MRI LUMBAR SPINE WITHOUT CONTRAST TECHNIQUE: Multiplanar, multisequence MR imaging of the lumbar spine was performed. No intravenous contrast was administered. COMPARISON:  CT same day FINDINGS: Segmentation: There are 5 non-rib bearing lumbar type vertebral bodies with the last intervertebral disc space labeled as L5-S1.  Alignment:  Normal Vertebrae: The vertebral body heights are well maintained. No fracture, marrow edema,or pathologic marrow infiltration. Conus medullaris and cauda equina: Conus extends to the L1 level. Conus and cauda equina appear normal. Paraspinal and other soft tissues: The paraspinal soft tissues and visualized retroperitoneal structures are unremarkable. The sacroiliac joints are intact. Disc levels: T12-L1:  No significant canal or neural foraminal narrowing. L1-L2:   No significant canal or neural foraminal narrowing. L2-L3:   No significant canal or neural foraminal narrowing. L3-L4:   No significant canal or neural foraminal narrowing. L4-L5: There is a broad-based disc bulge with a small posterior annular fissure present. No significant canal or neural foraminal narrowing is seen. L5-S1: There is a broad-based disc bulge and facet arthrosis which causes mild bilateral neural foraminal narrowing. IMPRESSION: Mild lower lumbar spine spondylosis most notable at L4-L5 with a posterior annular fissure. Electronically Signed   By: Jonna ClarkBindu   Avutu M.D.   On: 03/31/2019 02:45   DG Pelvis Portable  Result Date: 03/30/2019 CLINICAL DATA:  Fall, severe left hip pain, left leg neuro deficit EXAM: PORTABLE PELVIS 1-2 VIEWS COMPARISON:  None. FINDINGS: Bones of the pelvis are intact. No abnormal diastatic widening of the symphysis pubis or SI joints. Femoral heads are normally located. No proximal femoral fractures are identified. There are 2 radiopaque IUDs projecting over the level of the pelvis. Remaining soft tissues are unremarkable. IMPRESSION: No acute traumatic findings in the pelvis. 2 IUDs visualized in the pelvis.  Correlate with procedural history. Electronically Signed   By: Kreg ShropshirePrice  DeHay M.D.   On: 03/30/2019 22:57   CT L-SPINE NO CHARGE  Result Date: 03/30/2019 CLINICAL DATA:  Fall EXAM: CT LUMBAR SPINE WITHOUT CONTRAST TECHNIQUE: Multidetector CT imaging of the lumbar spine was performed without intravenous contrast administration. Multiplanar CT image reconstructions were also generated. COMPARISON:  None. FINDINGS: Segmentation: 5 lumbar type vertebrae. Alignment: Normal. Vertebrae: No acute fracture or focal pathologic process. Paraspinal and other soft tissues: Negative. Disc levels: No spinal canal stenosis. IMPRESSION: No acute fracture or static subluxation of the lumbar spine. Electronically Signed   By: Deatra RobinsonKevin  Herman M.D.   On: 03/30/2019 23:22   DG Chest Portable 1 View  Result Date: 03/30/2019 CLINICAL DATA:  Fall EXAM: PORTABLE CHEST 1 VIEW COMPARISON:  None. FINDINGS: No consolidation, features of edema, pneumothorax, or effusion. Pulmonary vascularity is normally distributed. The cardiomediastinal contours are unremarkable. No acute osseous or soft tissue abnormality. Telemetry leads overlie the chest. IMPRESSION: No acute cardiopulmonary abnormality. Electronically Signed   By: Kreg ShropshirePrice  DeHay M.D.   On: 03/30/2019 23:00      The results of significant diagnostics from this hospitalization (including imaging,  microbiology, ancillary and laboratory) are listed below for reference.     Microbiology: Recent Results (from the past 240 hour(s))  Respiratory Panel by RT PCR (Flu A&B, Covid) - Nasopharyngeal Swab     Status: None   Collection Time: 03/30/19  9:16 PM   Specimen: Nasopharyngeal Swab  Result Value Ref Range Status   SARS Coronavirus 2 by RT PCR NEGATIVE NEGATIVE Final    Comment: (NOTE) SARS-CoV-2 target nucleic acids are NOT DETECTED. The SARS-CoV-2 RNA is generally detectable in upper respiratoy specimens during the acute phase of infection. The lowest concentration of SARS-CoV-2 viral copies this assay can detect is 131 copies/mL. A negative result does not preclude SARS-Cov-2 infection and should not be used as the sole basis for treatment or other patient management decisions. A negative result may occur  with  improper specimen collection/handling, submission of specimen other than nasopharyngeal swab, presence of viral mutation(s) within the areas targeted by this assay, and inadequate number of viral copies (<131 copies/mL). A negative result must be combined with clinical observations, patient history, and epidemiological information. The expected result is Negative. Fact Sheet for Patients:  https://www.moore.com/ Fact Sheet for Healthcare Providers:  https://www.young.biz/ This test is not yet ap proved or cleared by the [place] FDA and  has been authorized for detection and/or diagnosis of SARS-CoV-2 by FDA under an Emergency Use Authorization (EUA). This EUA will remain  in effect (meaning this test can be used) for the duration of the COVID-19 declaration under Section 564(b)(1) of the Act, 21 U.S.C. section 360bbb-3(b)(1), unless the authorization is terminated or revoked sooner.    Influenza A by PCR NEGATIVE NEGATIVE Final   Influenza B by PCR NEGATIVE NEGATIVE Final    Comment: (NOTE) The Xpert Xpress  SARS-CoV-2/FLU/RSV assay is intended as an aid in  the diagnosis of influenza from Nasopharyngeal swab specimens and  should not be used as a sole basis for treatment. Nasal washings and  aspirates are unacceptable for Xpert Xpress SARS-CoV-2/FLU/RSV  testing. Fact Sheet for Patients: https://www.moore.com/ Fact Sheet for Healthcare Providers: https://www.young.biz/ This test is not yet approved or cleared by the [place] FDA and  has been authorized for detection and/or diagnosis of SARS-CoV-2 by  FDA under an Emergency Use Authorization (EUA). This EUA will remain  in effect (meaning this test can be used) for the duration of the  Covid-19 declaration under Section 564(b)(1) of the Act, 21  U.S.C. section 360bbb-3(b)(1), unless the authorization is  terminated or revoked. Performed at [facility], [address], [place], [place] [address]      Labs: BNP (last 3 results) No results for input(s): BNP in the last 8760 hours. Basic Metabolic Panel: Recent Labs  Lab 03/30/19 2116 03/30/19 2140  NA 140  --   K 4.0  --   CL 104  --   CO2 27  --   GLUCOSE 97  --   BUN 7  --   CREATININE 0.84 0.80  CALCIUM 9.3  --    Liver Function Tests: Recent Labs  Lab 03/30/19 2116  AST 18  ALT 18  ALKPHOS 65  BILITOT 0.3  PROT 6.6  ALBUMIN 3.8   No results for input(s): LIPASE, AMYLASE in the last 168 hours. No results for input(s): AMMONIA in the last 168 hours. CBC: Recent Labs  Lab 03/30/19 2116  WBC 8.1  NEUTROABS 4.3  HGB 13.2  HCT 41.7  MCV 86.3  PLT 336   Cardiac Enzymes: No results for input(s): CKTOTAL, CKMB, CKMBINDEX, TROPONINI in the last 168 hours. BNP: Invalid input(s): POCBNP CBG: No results for input(s): GLUCAP in the last 168 hours. D-Dimer No results for input(s): DDIMER in the last 72 hours. Hgb A1c No results for input(s): HGBA1C in the last 72 hours. Lipid Profile No results for input(s):  CHOL, HDL, LDLCALC, TRIG, CHOLHDL, LDLDIRECT in the last 72 hours. Thyroid function studies No results for input(s): TSH, T4TOTAL, T3FREE, THYROIDAB in the last 72 hours.  Invalid input(s): FREET3 Anemia work up No results for input(s): VITAMINB12, FOLATE, FERRITIN, TIBC, IRON, RETICCTPCT in the last 72 hours. Urinalysis    Component Value Date/Time   COLORURINE YELLOW (A) 02/12/2018 1542   APPEARANCEUR HAZY (A) 02/12/2018 1542   LABSPEC 1.006 02/12/2018 1542   PHURINE 6.0 02/12/2018 1542  GLUCOSEU NEGATIVE 02/12/2018 1542   HGBUR NEGATIVE 02/12/2018 1542   [place] 02/12/2018 1542   KETONESUR NEGATIVE 02/12/2018 1542   PROTEINUR NEGATIVE 02/12/2018 1542   NITRITE NEGATIVE 02/12/2018 1542   LEUKOCYTESUR NEGATIVE 02/12/2018 1542   Sepsis Labs Invalid input(s): PROCALCITONIN,  WBC,  LACTICIDVEN Microbiology Recent Results (from the past 240 hour(s))  Respiratory Panel by RT PCR (Flu A&B, Covid) - Nasopharyngeal Swab     Status: None   Collection Time: 03/30/19  9:16 PM   Specimen: Nasopharyngeal Swab  Result Value Ref Range Status   SARS Coronavirus 2 by RT PCR NEGATIVE NEGATIVE Final    Comment: (NOTE) SARS-CoV-2 target nucleic acids are NOT DETECTED. The SARS-CoV-2 RNA is generally detectable in upper respiratoy specimens during the acute phase of infection. The lowest concentration of SARS-CoV-2 viral copies this assay can detect is 131 copies/mL. A negative result does not preclude SARS-Cov-2 infection and should not be used as the sole basis for treatment or other patient management decisions. A negative result may occur with  improper specimen collection/handling, submission of specimen other than nasopharyngeal swab, presence of viral mutation(s) within the areas targeted by this assay, and inadequate number of viral copies (<131 copies/mL). A negative result must be combined with clinical observations, patient history, and epidemiological information.  The expected result is Negative. Fact Sheet for Patients:  PinkCheek.be Fact Sheet for Healthcare Providers:  GravelBags.it This test is not yet ap proved or cleared by the [place] FDA and  has been authorized for detection and/or diagnosis of SARS-CoV-2 by FDA under an Emergency Use Authorization (EUA). This EUA will remain  in effect (meaning this test can be used) for the duration of the COVID-19 declaration under Section 564(b)(1) of the Act, 21 U.S.C. section 360bbb-3(b)(1), unless the authorization is terminated or revoked sooner.    Influenza A by PCR NEGATIVE NEGATIVE Final   Influenza B by PCR NEGATIVE NEGATIVE Final    Comment: (NOTE) The Xpert Xpress SARS-CoV-2/FLU/RSV assay is intended as an aid in  the diagnosis of influenza from Nasopharyngeal swab specimens and  should not be used as a sole basis for treatment. Nasal washings and  aspirates are unacceptable for Xpert Xpress SARS-CoV-2/FLU/RSV  testing. Fact Sheet for Patients: PinkCheek.be Fact Sheet for Healthcare Providers: GravelBags.it This test is not yet approved or cleared by the [place] FDA and  has been authorized for detection and/or diagnosis of SARS-CoV-2 by  FDA under an Emergency Use Authorization (EUA). This EUA will remain  in effect (meaning this test can be used) for the duration of the  Covid-19 declaration under Section 564(b)(1) of the Act, 21  U.S.C. section 360bbb-3(b)(1), unless the authorization is  terminated or revoked. Performed at [facility], [address] [place], [place] [address]      Time coordinating discharge in minutes: 81  SIGNED:   Debbe Odea, MD  [facility] 04/01/2019, 2:13 PM Pager   If 7PM-7AM, please contact night-coverage [email or web address] Password [email or web address]

## 2019-04-01 NOTE — Progress Notes (Signed)
Physical Therapy Treatment Patient Details Name: Jill Daniel MRN: [id number] DOB: Sep 27, 1990 Today's Date: 04/01/2019    History of Present Illness Jill Daniel is an 28 y.o. female with bipolar I disorder, seizures anemia and anxiety who presented to the ED from home via EMS with c/c of a fall Saturday evening secondary to right knee giveway weakness. She fell down 4 stairs and landed on her knees. She did not hit her head or experience LOC. She was unable to get up and ambulate after the fall. On arrival to the Ed shw was complaining of RLE sensory numbness, paresthesias and weakness extending down her thigh and leg from her hip. Also with back and right hip pain, with pain also extending down her leg. Inconsistencies in Neuro exam, including positive Hoover sign, possible nonorganic etiology of RLE weakness    PT Comments    Continuing work on functional mobility and activity tolerance;  Notable improvements in bed mobility, transfers, and ambulation; No overt R knee buckling noted today; stair training done; OK for dc home from PT standpoint    Follow Up Recommendations  Outpatient PT     Equipment Recommendations  Rolling walker with 5" wheels;3in1 (PT)    Recommendations for Other Services       Precautions / Restrictions Precautions Precautions: Fall Precaution Comments: Fall risk lower with use of RW Restrictions Weight Bearing Restrictions: No    Mobility  Bed Mobility Overal bed mobility: Needs Assistance Bed Mobility: Supine to Sit     Supine to sit: Modified independent (Device/Increase time)     General bed mobility comments: Incr time  Transfers Overall transfer level: Needs assistance Equipment used: Rolling walker (2 wheeled) Transfers: Sit to/from Stand Sit to Stand: Supervision         General transfer comment: Cues for safe hand placement  Ambulation/Gait Ambulation/Gait assistance: Min guard;Supervision Gait Distance (Feet): 50  Feet Assistive device: Rolling walker (2 wheeled) Gait Pattern/deviations: Step-through pattern;Decreased step length - right;Decreased step length - left;Decreased stride length Gait velocity: slow   General Gait Details: Good support on RW; no overt buckling noted today   Stairs Stairs: Yes Stairs assistance: Min guard Stair Management: One rail Right;Step to pattern;Sideways Number of Stairs: 3 General stair comments: demonstration first to show technique; Managed step by step well; tired and slightly more painful after stair training   Wheelchair Mobility    Modified Rankin (Stroke Patients Only)       Balance                                            Cognition Arousal/Alertness: Awake/alert Behavior During Therapy: Flat affect Overall Cognitive Status: Within Functional Limits for tasks assessed                                        Exercises      General Comments        Pertinent Vitals/Pain Pain Assessment: 0-10 Pain Score: 4  Pain Location: R Hip Pain Descriptors / Indicators: Aching;Grimacing;Guarding Pain Intervention(s): Monitored during session    Home Living                      Prior Function  PT Goals (current goals can now be found in the care plan section) Acute Rehab PT Goals Patient Stated Goal: hopes to be home soon PT Goal Formulation: With patient Time For Goal Achievement: 04/14/19 Potential to Achieve Goals: Good Progress towards PT goals: Progressing toward goals    Frequency    Min 3X/week      PT Plan Current plan remains appropriate    Co-evaluation              AM-PAC PT "6 Clicks" Mobility   Outcome Measure  Help needed turning from your back to your side while in a flat bed without using bedrails?: None Help needed moving from lying on your back to sitting on the side of a flat bed without using bedrails?: None Help needed moving to and from a bed  to a chair (including a wheelchair)?: A Little Help needed standing up from a chair using your arms (e.g., wheelchair or bedside chair)?: A Little Help needed to walk in hospital room?: A Little Help needed climbing 3-5 steps with a railing? : A Little 6 Click Score: 20    End of Session Equipment Utilized During Treatment: Gait belt Activity Tolerance: Patient tolerated treatment well Patient left: Other (comment)(in recliner, about to work with OT) Nurse Communication: Mobility status PT Visit Diagnosis: Unsteadiness on feet (R26.81);Other abnormalities of gait and mobility (R26.89);Muscle weakness (generalized) (M62.81)     Time: 2876-8115 PT Time Calculation (min) (ACUTE ONLY): 23 min  Charges:  $Gait Training: 23-37 mins                     Roney Marion, [place] Pager [phone] Office [place] 04/01/2019, 11:11 AM

## 2019-04-01 NOTE — Evaluation (Signed)
Occupational Therapy Evaluation Patient Details Name: Jill Daniel MRN: [id number] DOB: [birth year] Today's Date: 04/01/2019    History of Present Illness Jill Daniel is an 28 y.o. female with bipolar I disorder, seizures anemia and anxiety who presented to the ED from home via EMS with c/c of a fall Saturday evening secondary to right knee giveway weakness. She fell down 4 stairs and landed on her knees. She did not hit her head or experience LOC. She was unable to get up and ambulate after the fall. On arrival to the Ed shw was complaining of RLE sensory numbness, paresthesias and weakness extending down her thigh and leg from her hip. Also with back and right hip pain, with pain also extending down her leg. Inconsistencies in Neuro exam, including positive Hoover sign, possible nonorganic etiology of RLE weakness   Clinical Impression   Pt at min A level with LB ADLs/selfcare; pt educated on use of reacher and LH bath sponge for LB selfcare at home. Pt is sup for safety with mobility using RW and will d/c this afternoon to her grandmother's home and will have assist prn. All education completed and no further acute OT is indicated at this time, OT will sign off     Follow Up Recommendations  No OT follow up;Supervision - Intermittent    Equipment Recommendations  3 in 1 bedside commode    Recommendations for Other Services       Precautions / Restrictions Precautions Precautions: Fall Precaution Comments: Fall risk lower with use of RW Restrictions Weight Bearing Restrictions: No      Mobility Bed Mobility Overal bed mobility: Needs Assistance Bed Mobility: Sit to Supine     Supine to sit: Modified independent (Device/Increase time) Sit to supine: Modified independent (Device/Increase time)   General bed mobility comments: pt in recliner after working with PT. Pt back to bed Mod I  Transfers Overall transfer level: Needs assistance Equipment used: Rolling walker  (2 wheeled) Transfers: Sit to/from Stand Sit to Stand: Supervision         General transfer comment: Cues for safe hand placement    Balance Overall balance assessment: Mild deficits observed, not formally tested                                         ADL either performed or assessed with clinical judgement   ADL Overall ADL's : Needs assistance/impaired Eating/Feeding: Independent;Sitting   Grooming: Wash/dry hands;Wash/dry face;Supervision/safety;Standing   Upper Body Bathing: Set up;Independent;Sitting   Lower Body Bathing: Minimal assistance;Sitting/lateral leans;Sit to/from stand   Upper Body Dressing : Set up;Independent;Sitting   Lower Body Dressing: Minimal assistance;Sitting/lateral leans;Sit to/from stand   Toilet Transfer: Supervision/safety;Ambulation;RW;Comfort height toilet;Cueing for safety   Toileting- Clothing Manipulation and Hygiene: Supervision/safety;Sit to/from stand   Tub/ Shower Transfer: Supervision/safety;Ambulation;Rolling walker;3 in 1;Cueing for safety   Functional mobility during ADLs: Supervision/safety;Rolling walker;Cueing for safety General ADL Comments: pt educated on use of reacher and LH bath sponge for LB selfcare at home     Vision Baseline Vision/History: Wears glasses Patient Visual Report: No change from baseline       Perception     Praxis      Pertinent Vitals/Pain Pain Assessment: 0-10 Pain Score: 5  Pain Location: R Hip Pain Descriptors / Indicators: Aching;Grimacing;Guarding Pain Intervention(s): Monitored during session;Repositioned     Hand Dominance Right   Extremity/Trunk Assessment  Upper Extremity Assessment Upper Extremity Assessment: Overall WFL for tasks assessed   Lower Extremity Assessment Lower Extremity Assessment: Defer to PT evaluation   Cervical / Trunk Assessment Cervical / Trunk Assessment: Normal   Communication Communication Communication: No difficulties    Cognition Arousal/Alertness: Awake/alert Behavior During Therapy: Flat affect Overall Cognitive Status: Within Functional Limits for tasks assessed                                     General Comments       Exercises     Shoulder Instructions      Home Living Family/patient expects to be discharged to:: Private residence Living Arrangements: Other relatives Available Help at Discharge: Family Type of Home: House Home Access: Stairs to enter Technical brewer of Steps: 3 Entrance Stairs-Rails: Right;Left Home Layout: Two level;Bed/bath upstairs     Bathroom Shower/Tub: Occupational psychologist: Standard     Home Equipment: None   Additional Comments: pt has no equipment but states that he grandparents have a reacher and LH bath sponge      Prior Functioning/Environment Level of Independence: Independent        Comments: Daughter turned 1 on 12/7        OT Problem List: Impaired balance (sitting and/or standing);Pain;Decreased activity tolerance;Decreased knowledge of use of DME or AE      OT Treatment/Interventions:      OT Goals(Current goals can be found in the care plan section) Acute Rehab OT Goals Patient Stated Goal: hopes to be home soon  OT Frequency:     Barriers to D/C:            Co-evaluation              AM-PAC OT "6 Clicks" Daily Activity     Outcome Measure Help from another person eating meals?: None Help from another person taking care of personal grooming?: None Help from another person toileting, which includes using toliet, bedpan, or urinal?: A Little Help from another person bathing (including washing, rinsing, drying)?: A Little Help from another person to put on and taking off regular upper body clothing?: None Help from another person to put on and taking off regular lower body clothing?: A Little 6 Click Score: 21   End of Session Equipment Utilized During Treatment: Gait belt;Rolling  walker;Other (comment)(3 in 1)  Activity Tolerance: Patient tolerated treatment well Patient left: in bed;with call bell/phone within reach  OT Visit Diagnosis: Unsteadiness on feet (R26.81);Other abnormalities of gait and mobility (R26.89);Pain Pain - Right/Left: Right Pain - part of body: Knee;Leg                Time: 7672-0947 OT Time Calculation (min): 19 min Charges:  OT General Charges $OT Visit: 1 Visit OT Evaluation $OT Eval Low Complexity: 1 Low    Britt Bottom 04/01/2019, 11:20 AM

## 2019-04-01 NOTE — Progress Notes (Signed)
Provided discharge education/instructions, all questions and concerns addressed, Pt not in distress, discharged home with DME and meds from [facility].

## 2019-04-01 NOTE — TOC Initial Note (Addendum)
Transition of Care [facility]) - Initial/Assessment Note    Patient Details  Name: Jill Daniel MRN: [id number] Date of Birth: [birth year]  Transition of Care [facility]) CM/SW Contact:    Marilu Favre, RN Phone Number: 04/01/2019, 10:48 AM  Clinical Narrative:                 Confirmed face sheet information with patient.   Patient states she has no PCP. NCM explained medicaid patients are usually assigned a PCP on Medicaid card. Patient states hers is [facility] in [place]. She will call to arrange an appointment. Discussed OP PT , they will need name of PCP, patient voices understanding.  Zack with [facility] aware patient needs walker and 3 in 1    Expected Discharge Plan: Home/Self Care Barriers to Discharge: No Barriers Identified   Patient Goals and CMS Choice Patient states their goals for this hospitalization and ongoing recovery are:: to go home CMS Medicare.gov Compare Post Acute Care list provided to:: Patient Choice offered to / list presented to : Patient  Expected Discharge Plan and Services Expected Discharge Plan: Home/Self Care   Discharge Planning Services: CM Consult   Living arrangements for the past 2 months: Single Family Home Expected Discharge Date: 04/01/19                         [facility] Arranged: NA          Prior Living Arrangements/Services Living arrangements for the past 2 months: Single Family Home Lives with:: Relatives Patient language and need for interpreter reviewed:: Yes        Need for Family Participation in Patient Care: Yes (Comment) Care giver support system in place?: Yes (comment)   Criminal Activity/Legal Involvement Pertinent to Current Situation/Hospitalization: No - Comment as needed  Activities of Daily Living   ADL Screening (condition at time of admission) Patient's cognitive ability adequate to safely complete daily activities?: Yes Is the patient deaf or have difficulty hearing?: No Does the patient  have difficulty seeing, even when wearing glasses/contacts?: Yes Does the patient have difficulty concentrating, remembering, or making decisions?: No Patient able to express need for assistance with ADLs?: Yes Does the patient have difficulty dressing or bathing?: No Independently performs ADLs?: Yes (appropriate for developmental age) Does the patient have difficulty walking or climbing stairs?: Yes Weakness of Legs: Both Weakness of Arms/Hands: None  Permission Sought/Granted   Permission granted to share information with : No              Emotional Assessment   Attitude/Demeanor/Rapport: Engaged Affect (typically observed): Accepting Orientation: : Oriented to Situation, Oriented to  Time, Oriented to Place, Oriented to Self Alcohol / Substance Use: Not Applicable    Admission diagnosis:  Back pain [M54.9] Right leg weakness [R29.898] Fall, initial encounter [W19.XXXA] Weakness of right lower extremity [R29.898] Patient Active Problem List   Diagnosis Date Noted  . Weakness of right lower extremity 03/31/2019  . Anxiety 03/31/2019  . Numbness and tingling of right lower extremity 03/31/2019  . Encounter for screening for HIV 03/31/2019  . Placental abruption in third trimester 02/19/2018  . Vaginal bleeding, abnormal 01/31/2018  . Vaginal bleeding in pregnancy, third trimester 01/21/2018  . Preterm premature rupture of membranes (PPROM) with unknown onset of labor 01/21/2018  . Bipolar I disorder ([place]) 11/16/2015  . Panic attacks 11/16/2015   PCP:  Patient, No Pcp Per Pharmacy:   [facility] [place],  [place] - [address] AT [facility] OF [address] [place] [address] Phone: [phone] Fax: [phone]  [id number] - [place], [place] - [address] AT [facility] OF [address] [facility] [address] & [address] [place] [address] Phone: [phone] Fax:  [phone]  [id number] [place], [place] - [address] AT [facility] OF [address] [place] [address] Phone: [phone] Fax: [phone]  [id number] - [place] [facility] - [address] [place] [address] Phone: [phone] Fax: [phone]  [facility] [place], [place] - [address] [place] [address] Phone: [phone] Fax: [phone]     Social Determinants of Health (SDOH) Interventions    Readmission Risk Interventions No flowsheet data found.

## 2019-04-01 NOTE — Plan of Care (Signed)

## 2019-04-01 NOTE — Plan of Care (Signed)

## 2019-10-05 ENCOUNTER — Emergency Department
Admission: EM | Admit: 2019-10-05 | Discharge: 2019-10-05 | Disposition: A | Discharge status: Home / Self Care | Payer: Medicaid Other | Attending: Emergency Medicine | Admitting: Emergency Medicine

## 2019-10-05 ENCOUNTER — Other Ambulatory Visit: Payer: Self-pay

## 2019-10-05 ENCOUNTER — Emergency Department: Payer: Medicaid Other

## 2019-10-05 ENCOUNTER — Encounter: Payer: Self-pay | Admitting: Emergency Medicine

## 2019-10-05 DIAGNOSIS — R109 Unspecified abdominal pain: Secondary | ICD-10-CM

## 2019-10-05 DIAGNOSIS — Z3202 Encounter for pregnancy test, result negative: Secondary | ICD-10-CM | POA: Diagnosis not present

## 2019-10-05 DIAGNOSIS — M545 Low back pain, unspecified: Secondary | ICD-10-CM

## 2019-10-05 DIAGNOSIS — R197 Diarrhea, unspecified: Secondary | ICD-10-CM | POA: Diagnosis not present

## 2019-10-05 DIAGNOSIS — R112 Nausea with vomiting, unspecified: Secondary | ICD-10-CM | POA: Diagnosis not present

## 2019-10-05 DIAGNOSIS — Z87891 Personal history of nicotine dependence: Secondary | ICD-10-CM | POA: Insufficient documentation

## 2019-10-05 DIAGNOSIS — R1030 Lower abdominal pain, unspecified: Secondary | ICD-10-CM | POA: Insufficient documentation

## 2019-10-05 LAB — COMPREHENSIVE METABOLIC PANEL
ALT: 17 U/L (ref 0–44)
AST: 16 U/L (ref 15–41)
Albumin: 3.9 g/dL (ref 3.5–5.0)
Alkaline Phosphatase: 66 U/L (ref 38–126)
Anion gap: 9 (ref 5–15)
BUN: 7 mg/dL (ref 6–20)
CO2: 24 mmol/L (ref 22–32)
Calcium: 8.7 mg/dL — ABNORMAL LOW (ref 8.9–10.3)
Chloride: 105 mmol/L (ref 98–111)
Creatinine, Ser: 0.77 mg/dL (ref 0.44–1.00)
GFR calc Af Amer: >60 mL/min (ref >60)
GFR calc non Af Amer: >60 mL/min (ref >60)
Glucose, Bld: 103 mg/dL — ABNORMAL HIGH (ref 70–99)
Potassium: 4.2 mmol/L (ref 3.5–5.1)
Sodium: 138 mmol/L (ref 135–145)
Total Bilirubin: 0.4 mg/dL (ref 0.3–1.2)
Total Protein: 7.3 g/dL (ref 6.5–8.1)

## 2019-10-05 LAB — CBC
HCT: 40.5 % (ref 36.0–46.0)
Hemoglobin: 13.7 g/dL (ref 12.0–15.0)
MCH: 27.6 pg (ref 26.0–34.0)
MCHC: 33.8 g/dL (ref 30.0–36.0)
MCV: 81.7 fL (ref 80.0–100.0)
Platelets: 337 10*3/uL (ref 150–400)
RBC: 4.96 MIL/uL (ref 3.87–5.11)
RDW: 13.4 % (ref 11.5–15.5)
WBC: 7.9 10*3/uL (ref 4.0–10.5)
nRBC: 0 % (ref 0.0–0.2)

## 2019-10-05 LAB — URINALYSIS, COMPLETE (UACMP) WITH MICROSCOPIC
Bacteria, UA: NONE SEEN
Bilirubin Urine: NEGATIVE
Glucose, UA: NEGATIVE mg/dL
Hgb urine dipstick: NEGATIVE
Ketones, ur: NEGATIVE mg/dL
Nitrite: NEGATIVE
Protein, ur: NEGATIVE mg/dL
Specific Gravity, Urine: 1.012 (ref 1.005–1.030)
pH: 7 (ref 5.0–8.0)

## 2019-10-05 LAB — WET PREP, GENITAL
Clue Cells Wet Prep HPF POC: NONE SEEN
Sperm: NONE SEEN
Trich, Wet Prep: NONE SEEN
Yeast Wet Prep HPF POC: NONE SEEN

## 2019-10-05 LAB — POCT PREGNANCY, URINE: Preg Test, Ur: NEGATIVE

## 2019-10-05 LAB — LIPASE, BLOOD: Lipase: 34 U/L (ref 11–51)

## 2019-10-05 LAB — CHLAMYDIA/NGC RT PCR (ARMC ONLY)
Chlamydia Tr: NOT DETECTED
N gonorrhoeae: NOT DETECTED

## 2019-10-05 MED ORDER — PROMETHAZINE HCL 12.5 MG PO TABS: 12.5000 mg | ORAL_TABLET | Freq: Four times a day (QID) | ORAL | 0 refills | Status: DC | PRN

## 2019-10-05 MED ORDER — IOHEXOL 300 MG/ML  SOLN
100.0000 mL | Freq: Once | INTRAMUSCULAR | Status: AC | PRN
  Administered 2019-10-05: 100 mL via INTRAVENOUS

## 2019-10-05 MED ORDER — DICYCLOMINE HCL 10 MG PO CAPS
20.0000 mg | ORAL_CAPSULE | Freq: Once | ORAL | Status: AC
  Administered 2019-10-05: 20 mg via ORAL
  Filled 2019-10-05: qty 2

## 2019-10-05 MED ORDER — METHOCARBAMOL 500 MG PO TABS: 500.0000 mg | ORAL_TABLET | Freq: Three times a day (TID) | ORAL | 0 refills | Status: DC | PRN

## 2019-10-05 MED ORDER — KETOROLAC TROMETHAMINE 30 MG/ML IJ SOLN
15.0000 mg | Freq: Once | INTRAMUSCULAR | Status: DC
  Filled 2019-10-05: qty 1

## 2019-10-05 MED ORDER — ONDANSETRON 4 MG PO TBDP
4.0000 mg | ORAL_TABLET | Freq: Once | ORAL | Status: AC
  Administered 2019-10-05: 4 mg via ORAL
  Filled 2019-10-05: qty 1

## 2019-10-05 MED ORDER — NAPROXEN 250 MG PO TABS: 500.0000 mg | ORAL_TABLET | Freq: Two times a day (BID) | ORAL | 0 refills | Status: DC

## 2019-10-05 MED ORDER — KETOROLAC TROMETHAMINE 30 MG/ML IJ SOLN
30.0000 mg | Freq: Once | INTRAMUSCULAR | Status: AC
  Administered 2019-10-05: 30 mg via INTRAMUSCULAR

## 2019-10-05 MED ORDER — SODIUM CHLORIDE 0.9% FLUSH: 3.0000 mL | Freq: Once | INTRAVENOUS | Status: DC

## 2019-10-05 MED ORDER — DICYCLOMINE HCL 10 MG PO CAPS: 10.0000 mg | ORAL_CAPSULE | Freq: Four times a day (QID) | ORAL | 0 refills | Status: DC

## 2019-10-05 NOTE — ED Notes (Signed)
Pt given UA cup and instructed on use for sample.

## 2019-10-05 NOTE — ED Provider Notes (Signed)
[facility] Emergency Department Provider Note ____________________________________________   First MD Initiated Contact with Patient 10/05/19 1231     (approximate)  I have reviewed the triage vital signs and the nursing notes.   HISTORY  Chief Complaint Back Pain and Emesis  HPI Jill Daniel is a 29 y.o. female who presents to the emergency department for treatment and evaluation of lower back pain x 4 days and vomiting since yesterday evening. Unable to keep anything down today. No fever or abdominal pain. No diarrhea or constipation. No dysuria. She did see some streaks of blood the last time she vomited. No known exposure to others who are ill.          Past Medical History:  Diagnosis Date  . Anemia   . Anxiety   . Bipolar 1 disorder (HCC)   . Seizures [facility])     Patient Active Problem List   Diagnosis Date Noted  . Weakness of right lower extremity 03/31/2019  . Anxiety 03/31/2019  . Numbness and tingling of right lower extremity 03/31/2019  . Encounter for screening for HIV 03/31/2019  . Placental abruption in third trimester 02/19/2018  . Vaginal bleeding, abnormal 01/31/2018  . Vaginal bleeding in pregnancy, third trimester 01/21/2018  . Preterm premature rupture of membranes (PPROM) with unknown onset of labor 01/21/2018  . Bipolar I disorder (HCC) 11/16/2015  . Panic attacks 11/16/2015    Past Surgical History:  Procedure Laterality Date  . ADENOIDECTOMY    . tailbone    . TONSILLECTOMY    . WISDOM TOOTH EXTRACTION      Prior to Admission medications   Medication Sig Start Date End Date Taking? Authorizing Provider  acetaminophen (TYLENOL) 325 MG tablet Take 2 tablets (650 mg total) by mouth every 6 (six) hours as needed for mild pain (or Fever >/= 101). 04/01/19   Calvert Cantor, MD  ARIPiprazole (ABILIFY) 10 MG tablet Take 10 mg by mouth at bedtime. 11/17/18   [provider]  dicyclomine (BENTYL) 10 MG capsule  Take 1 capsule (10 mg total) by mouth 4 (four) times daily for 14 days. 10/05/19 10/19/19  Aimie Wagman, Rulon Eisenmenger B, FNP  escitalopram (LEXAPRO) 10 MG tablet Take 10 mg by mouth daily. 02/06/19   [provider]  methocarbamol (ROBAXIN) 500 MG tablet Take 1 tablet (500 mg total) by mouth every 8 (eight) hours as needed for muscle spasms. 10/05/19   Jager Koska, Rulon Eisenmenger B, FNP  naproxen (NAPROSYN) 250 MG tablet Take 2 tablets (500 mg total) by mouth 2 (two) times daily with a meal. 10/05/19   Joaquim Tolen B, FNP  oxyCODONE (OXY IR/ROXICODONE) 5 MG immediate release tablet Take 1-2 tablets (5-10 mg total) by mouth every 4 (four) hours as needed for moderate pain or severe pain. 04/01/19   Calvert Cantor, MD  promethazine (PHENERGAN) 12.5 MG tablet Take 1 tablet (12.5 mg total) by mouth every 6 (six) hours as needed for nausea or vomiting. 10/05/19   Chinita Pester, FNP  UNABLE TO FIND Out patient physical therapy 04/01/19   Calvert Cantor, MD    Allergies Decadron [dexamethasone], Grapefruit extract, and Tape  Family History  Problem Relation Age of Onset  . Depression Mother   . Depression Sister     Social History Social History   Tobacco Use  . Smoking status: Former Smoker    Packs/day: 0.00  . Smokeless tobacco: Never Used  Substance Use Topics  . Alcohol use: No    Comment: Socially  .  Drug use: No    Review of Systems  Constitutional: No fever/chills Eyes: No visual changes. ENT: No sore throat. Cardiovascular: Denies chest pain. Respiratory: Denies shortness of breath. Gastrointestinal: No abdominal pain.  Positive for nausea and vomiting.  No diarrhea.  No constipation. Genitourinary: Negative for dysuria. Musculoskeletal: Positive for back pain. Skin: Negative for rash. Neurological: Negative for headaches, focal weakness or numbness. ____________________________________________   PHYSICAL EXAM:  VITAL SIGNS: ED Triage Vitals  Enc Vitals Group     BP 10/05/19 1046 126/81      Pulse Rate 10/05/19 1046 91     Resp 10/05/19 1046 18     Temp 10/05/19 1046 98 F (36.7 C)     Temp Source 10/05/19 1046 Oral     SpO2 10/05/19 1046 100 %     Weight --      Height --      Head Circumference --      Peak Flow --      Pain Score 10/05/19 1056 5     Pain Loc --      Pain Edu? --      Excl. in GC? --     Constitutional: Alert and oriented. Acutely ill appearing and in no acute distress. Eyes: Conjunctivae are normal. PERRL. EOMI. Head: Atraumatic. Nose: No congestion/rhinnorhea. Mouth/Throat: Mucous membranes are moist.  Oropharynx non-erythematous. Neck: No stridor.   Hematological/Lymphatic/Immunilogical: No cervical lymphadenopathy. Cardiovascular: Normal rate, regular rhythm. Grossly normal heart sounds.  Good peripheral circulation. Respiratory: Normal respiratory effort.  No retractions. Lungs CTAB. Gastrointestinal: Soft and nontender. No distention. No abdominal bruits. No CVA tenderness. Genitourinary:  Musculoskeletal: No CVA tenderness. Transverse, non-focal low back pain increases with movement. No lower extremity tenderness nor edema.  No joint effusions. Neurologic:  Normal speech and language. No gross focal neurologic deficits are appreciated. No gait instability. Skin:  Skin is warm, dry and intact. No rash noted. Psychiatric: Mood and affect are normal. Speech and behavior are normal.  ____________________________________________   LABS (all labs ordered are listed, but only abnormal results are displayed)  Labs Reviewed  WET PREP, GENITAL - Abnormal; Notable for the following components:      Result Value   WBC, Wet Prep HPF POC FEW (*)    All other components within normal limits  COMPREHENSIVE METABOLIC PANEL - Abnormal; Notable for the following components:   Glucose, Bld 103 (*)    Calcium 8.7 (*)    All other components within normal limits  URINALYSIS, COMPLETE (UACMP) WITH MICROSCOPIC - Abnormal; Notable for the following  components:   Color, Urine YELLOW (*)    APPearance HAZY (*)    Leukocytes,Ua TRACE (*)    All other components within normal limits  CHLAMYDIA/NGC RT PCR ([facility] ONLY)  LIPASE, BLOOD  CBC  POC URINE PREG, ED  POCT PREGNANCY, URINE   ____________________________________________  EKG  Not indicated. ____________________________________________  RADIOLOGY  ED MD interpretation:    CT abdomen and pelvis negative for acute findings.  I, Kem Boroughs, personally viewed and evaluated these images (plain radiographs) as part of my medical decision making, as well as reviewing the written report by the radiologist.  Official radiology report(s): CT ABDOMEN PELVIS W CONTRAST  Result Date: 10/05/2019 CLINICAL DATA:  Abdominal pain EXAM: CT ABDOMEN AND PELVIS WITH CONTRAST TECHNIQUE: Multidetector CT imaging of the abdomen and pelvis was performed using the standard protocol following bolus administration of intravenous contrast. CONTRAST:  OMNIPAQUE IOHEXOL 300 MG/ML  SOLN COMPARISON:  CT abdomen pelvis dated 03/30/2019 FINDINGS: Lower chest: Mild bilateral dependent atelectasis is noted. Hepatobiliary: No focal liver abnormality is seen. No gallstones, gallbladder wall thickening, or biliary dilatation. Pancreas: Unremarkable. No pancreatic ductal dilatation or surrounding inflammatory changes. Spleen: Normal in size without focal abnormality. Adrenals/Urinary Tract: Adrenal glands are unremarkable. Kidneys are normal, without renal calculi, focal lesion, or hydronephrosis. Bladder is unremarkable. Stomach/Bowel: Stomach is within normal limits. Appendix appears normal. No evidence of bowel wall thickening, distention, or inflammatory changes. Vascular/Lymphatic: No significant vascular findings are present. No enlarged abdominal or pelvic lymph nodes. Reproductive: An intrauterine contraceptive device is noted. The adnexa are unremarkable. Other: No abdominal wall hernia or  abnormality. No abdominopelvic ascites. Musculoskeletal: No acute or significant osseous findings. IMPRESSION: No acute process in the abdomen or pelvis. Electronically Signed   By: Romona Curls M.D.   On: 10/05/2019 15:04    ____________________________________________   PROCEDURES  Procedure(s) performed (including Critical Care):  Procedures  ____________________________________________   INITIAL IMPRESSION / ASSESSMENT AND PLAN     29 year old female presenting to the emergency department for treatment and evaluation of vomiting and low back pain. See HPI for further details. Plan will be to await results of protocol labs drawn and give zofran for nausea. Exam is reassuring.  DIFFERENTIAL DIAGNOSIS  Pregnancy, acute cystitis, gastroenteritis  ED COURSE  No relief of symptoms with Toradol and Zofran.  Patient now states that she is having lower abdominal cramping and just feels "weird."  Plan will be to give her Bentyl and get a CT of her abdomen pelvis.  She says that her back does not hurt unless she moves and then she can feel "muscles pulling."  CBC, CMP, and lipase are negative. Pregnancy test is negative. CT of the abdomen and pelvis is negative.  Upon assessment by Dr. Fanny Bien, the patient mentioned having vaginal discharge.  Plan will be to do a pelvic exam.  Patient does have a thin yellow-tinged discharge.  Concern for chlamydia or gonorrhea.  Patient denies known exposures to STD.  She states that she is in a monogamous relationship and has not had intercourse with anyone else for a long time.  Will await results of swab before ordering antibiotic treatment.  Negative chlamydia, gonorrhea, and wet prep.  Patient will be discharged home with the original plan to treat her pain as more musculoskeletal.  She is to see primary care for symptoms that are not improving over the next few days.  She is to return to the emergency department for symptoms of change or worsen if  unable to schedule appointment. ____________________________________________   FINAL CLINICAL IMPRESSION(S) / ED DIAGNOSES  Final diagnoses:  Nausea vomiting and diarrhea  Abdominal cramping  Acute bilateral low back pain without sciatica     ED Discharge Orders         Ordered    dicyclomine (BENTYL) 10 MG capsule  4 times daily     Discontinue  Reprint     10/05/19 1525    promethazine (PHENERGAN) 12.5 MG tablet  Every 6 hours PRN     Discontinue  Reprint     10/05/19 1525    naproxen (NAPROSYN) 250 MG tablet  2 times daily with meals     Discontinue  Reprint     10/05/19 1525    methocarbamol (ROBAXIN) 500 MG tablet  Every 8 hours PRN     Discontinue  Reprint     10/05/19 1525  Shanna Ciscoatasha A Lorensen was evaluated in Emergency Department on 10/05/2019 for the symptoms described in the history of present illness. She was evaluated in the context of the global COVID-19 pandemic, which necessitated consideration that the patient might be at risk for infection with the SARS-CoV-2 virus that causes COVID-19. Institutional protocols and algorithms that pertain to the evaluation of patients at risk for COVID-19 are in a state of rapid change based on information released by regulatory bodies including the CDC and federal and state organizations. These policies and algorithms were followed during the patient's care in the ED.   Note:  This document was prepared using Dragon voice recognition software and may include unintentional dictation errors.   Chinita Pesterriplett, Josejulian Tarango B, FNP 10/05/19 2021    Sharyn CreamerQuale, Mark, MD 10/06/19 1556

## 2019-10-05 NOTE — ED Notes (Signed)
Pt getting undressed and pelvic cart at bedside

## 2019-10-05 NOTE — ED Provider Notes (Signed)
Medical screening examination/treatment/procedure(s) were conducted as a shared visit with non-physician practitioner(s) and myself.  I personally evaluated the patient during the encounter.    Patient resting.  No distress.  Mild tenderness to palpation suprapubically, no noted deep lower quadrant tenderness.  No focal pain at McBurney's point.  Patient reports symptoms ongoing frequently recurring with similar around time of her menstrual cycle.  She does also report a slight abnormal slightly thickened vaginal discharge and abnormal odor for about 1 week associated as well.  Nurse practitioner perform a pelvic exam with wet prep and GC.  Follow-up on results.  Patient overall appears well, nontoxic.  Reassuring CT without evidence of adnexal mass and clinical history and exam do not support perforation, torsion, sepsis, or noted life threatning OBGYN or abdominal process.   Sharyn Creamer, MD 10/05/19 [phone]

## 2019-10-05 NOTE — ED Triage Notes (Signed)
Pt to ED via POV c/o lower back pain x 4 days. Pt states that she is also having vomiting that started yesterday and she had 1 episode of bloody emesis this morning. Pt is in NAD, all VS WNL.

## 2019-10-05 NOTE — Discharge Instructions (Signed)
Please follow up with primary care for symptoms that are not improving over the next couple of days.  Return to the ER for symptoms that change or worsen if unable to schedule an appointment.

## 2021-02-16 IMAGING — CT CT ABD-PELV W/ CM
2 of 4 series · 16 of 46 positions shown, 18 images · IV contrast (APPLIED)
Comparison: CT abdomen pelvis dated 03/30/2019

CLINICAL DATA: Abdominal pain

EXAM:
CT ABDOMEN AND PELVIS WITH CONTRAST
TECHNIQUE: Multidetector CT imaging of the abdomen and pelvis was performed
using the standard protocol following bolus administration of
intravenous contrast.
CONTRAST:  100mL OMNIPAQUE IOHEXOL 300 MG/ML  SOLN

[Series 2: axial st · axial · 0.76mm/px · z∈[-505,-60]mm · 13 of 99 slices shown, 15 images]
[im 5/99  soft-tissue]
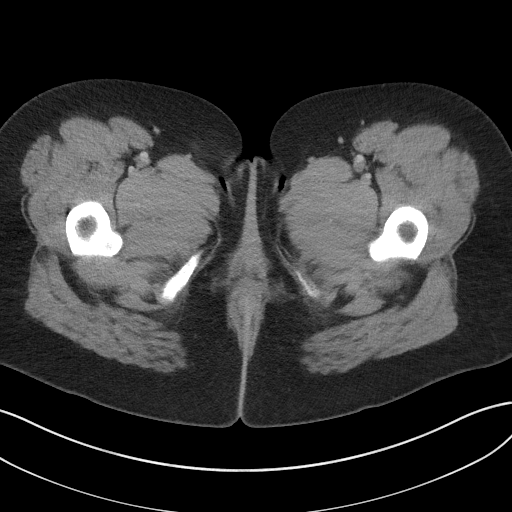
[im 5/99  bone]
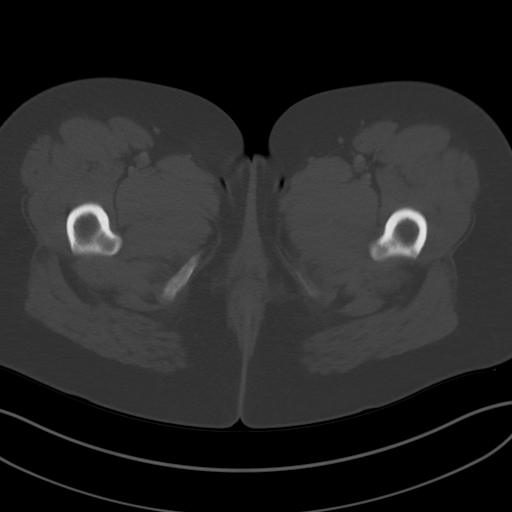
[im 13/99  soft-tissue]
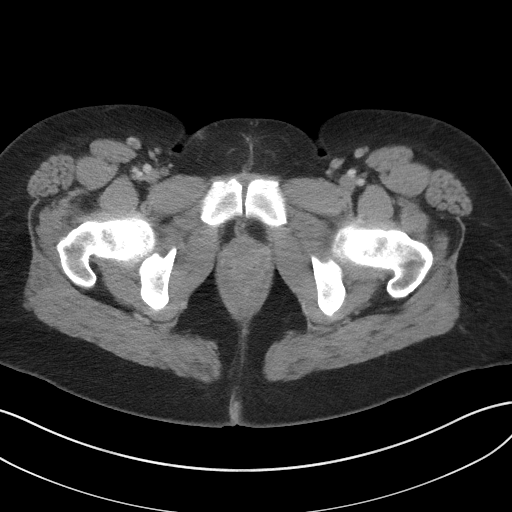
[im 21/99  soft-tissue]
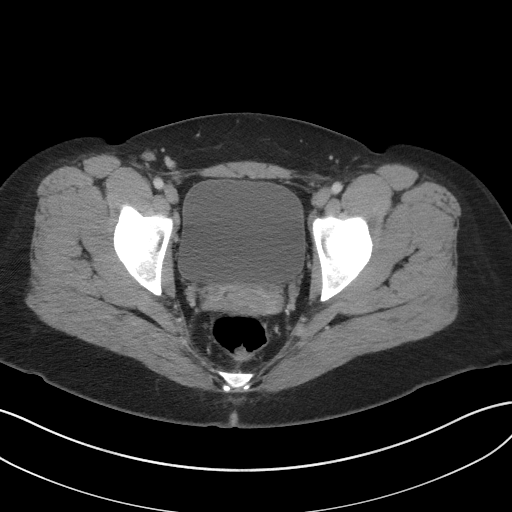
[im 29/99  soft-tissue]
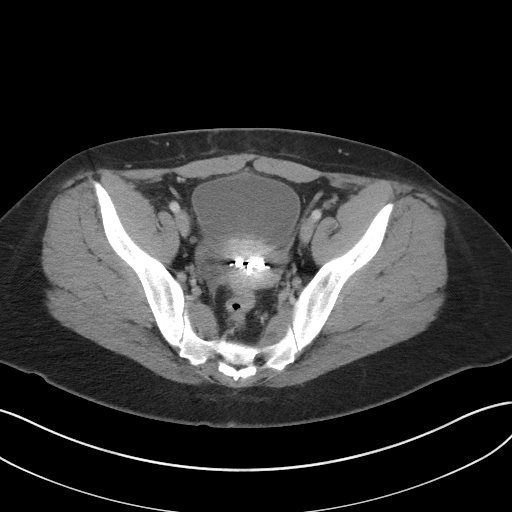
[im 33/99  soft-tissue]
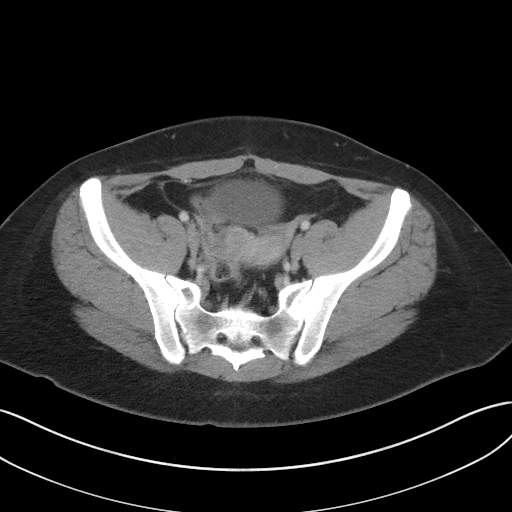
[im 41/99  soft-tissue]
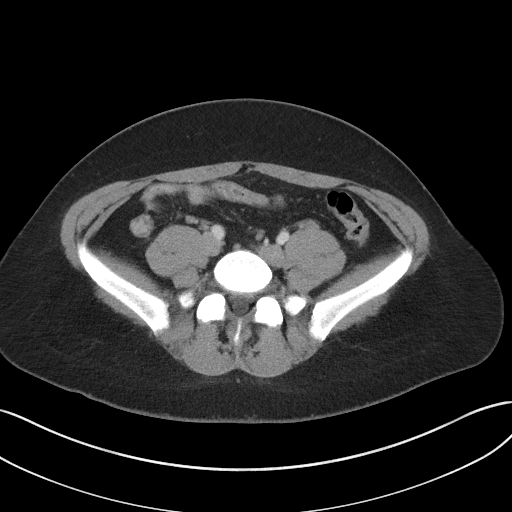
[im 50/99  soft-tissue]
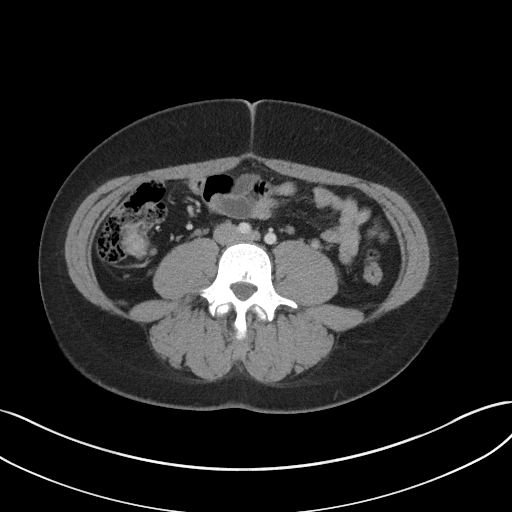
[im 58/99  soft-tissue]
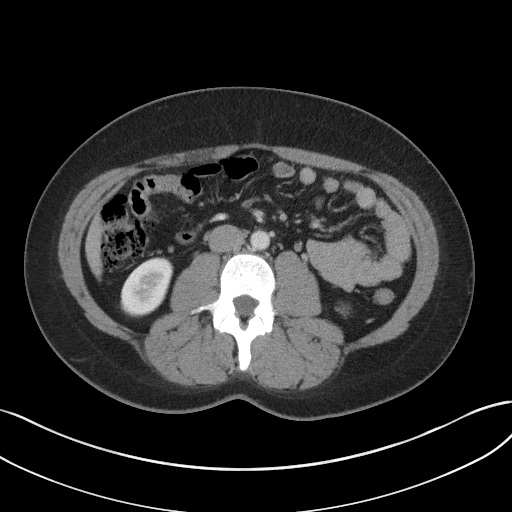
[im 66/99  soft-tissue]
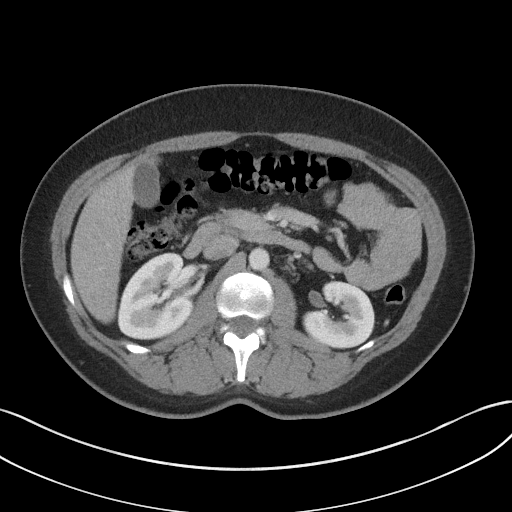
[im 66/99  bone]
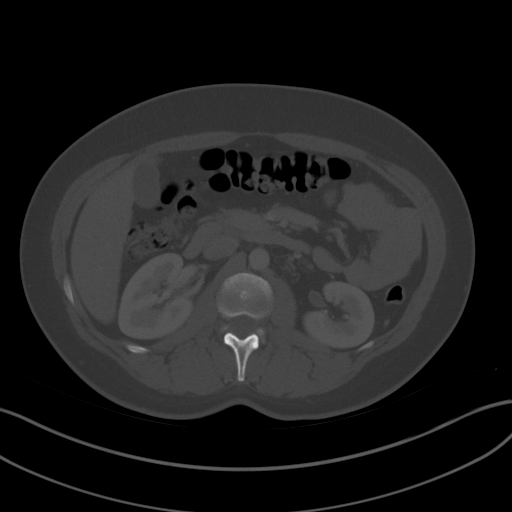
[im 70/99  soft-tissue]
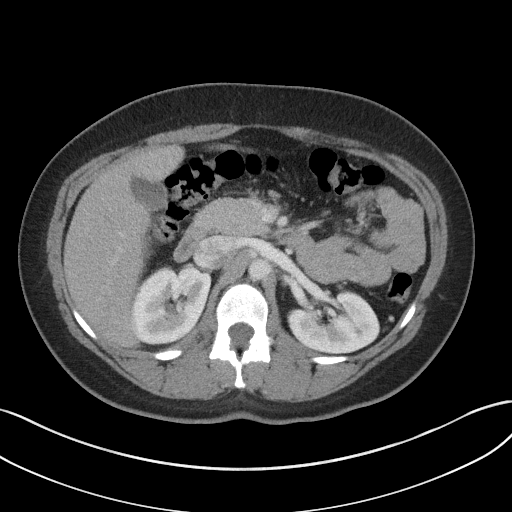
[im 78/99  soft-tissue]
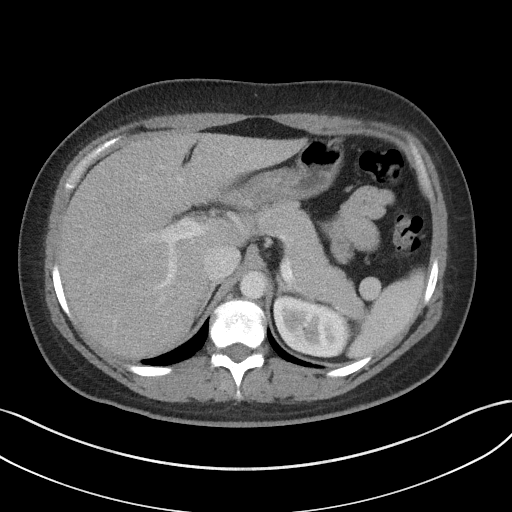
[im 86/99  soft-tissue]
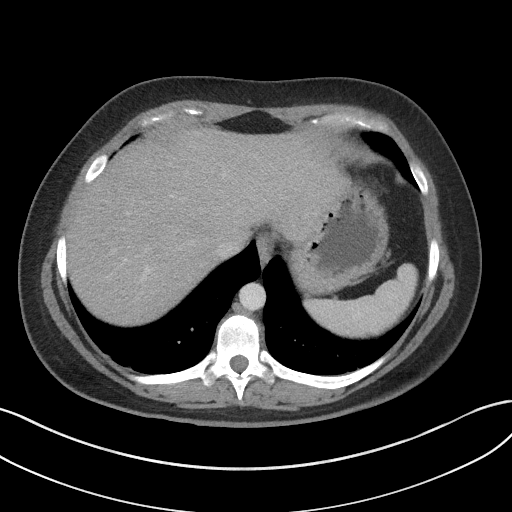
[im 94/99  soft-tissue]
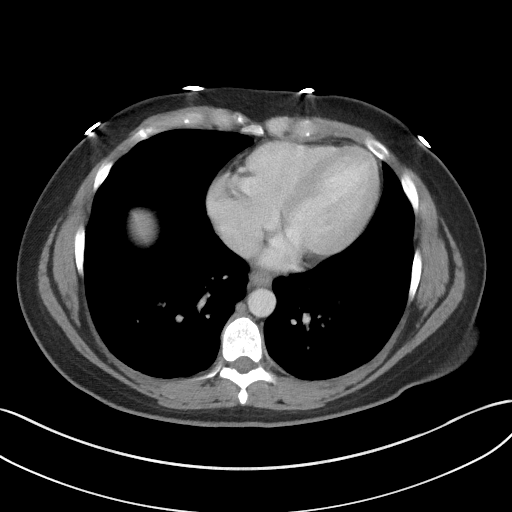

[Series 5: coronal st · coronal · 0.68mm/px · 3 of 100 slices shown]
[im 34/100  soft-tissue]
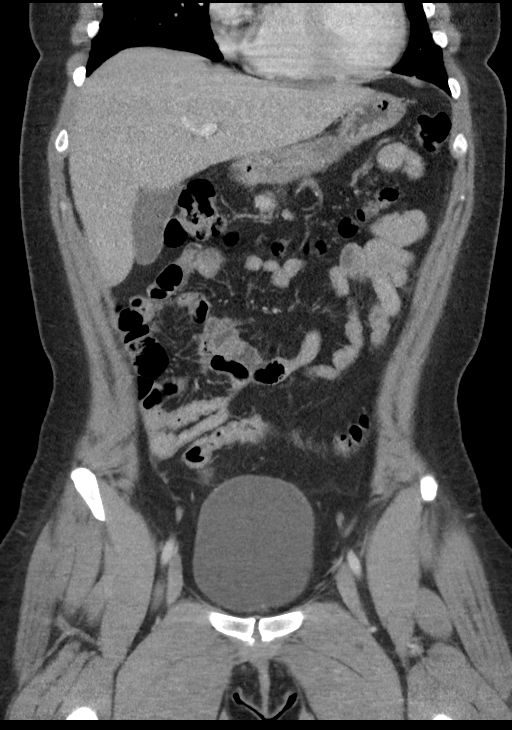
[im 45/100  soft-tissue]
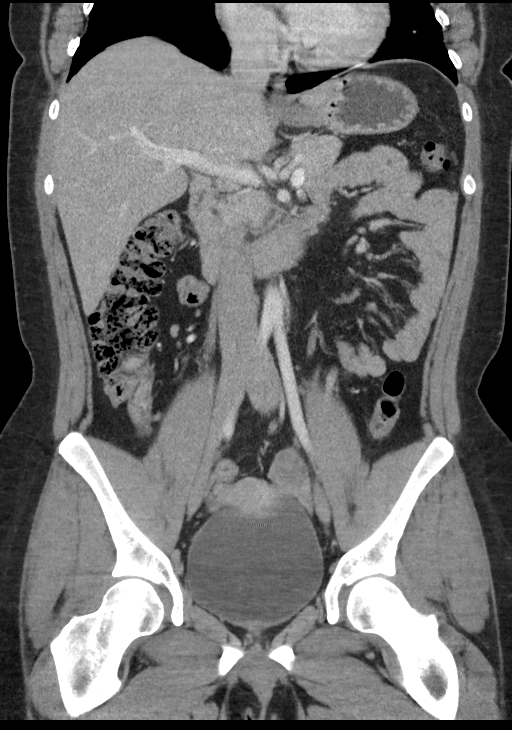
[im 56/100  soft-tissue]
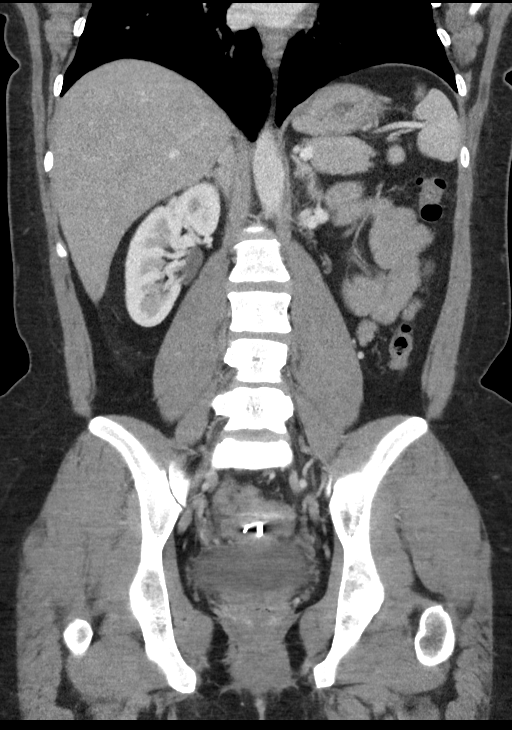

[16 of 46 positions shown; findings below may reference images not displayed]

FINDINGS: Lower chest: Mild bilateral dependent atelectasis is noted.

Hepatobiliary: No focal liver abnormality is seen. No gallstones,
gallbladder wall thickening, or biliary dilatation.

Pancreas: Unremarkable. No pancreatic ductal dilatation or
surrounding inflammatory changes.

Spleen: Normal in size without focal abnormality.

Adrenals/Urinary Tract: Adrenal glands are unremarkable. Kidneys are
normal, without renal calculi, focal lesion, or hydronephrosis.
Bladder is unremarkable.

Stomach/Bowel: Stomach is within normal limits. Appendix appears
normal. No evidence of bowel wall thickening, distention, or
inflammatory changes.

Vascular/Lymphatic: No significant vascular findings are present. No
enlarged abdominal or pelvic lymph nodes.

Reproductive: An intrauterine contraceptive device is noted. The
adnexa are unremarkable.

Other: No abdominal wall hernia or abnormality. No abdominopelvic
ascites.

Musculoskeletal: No acute or significant osseous findings.
IMPRESSION: No acute process in the abdomen or pelvis.

## 2023-08-05 ENCOUNTER — Ambulatory Visit
Admission: EM | Admit: 2023-08-05 | Discharge: 2023-08-05 | Disposition: A | Discharge status: Home / Self Care | Attending: Physician Assistant | Admitting: Physician Assistant

## 2023-08-05 DIAGNOSIS — K0889 Other specified disorders of teeth and supporting structures: Secondary | ICD-10-CM | POA: Diagnosis not present

## 2023-08-05 DIAGNOSIS — K047 Periapical abscess without sinus: Secondary | ICD-10-CM | POA: Diagnosis not present

## 2023-08-05 DIAGNOSIS — H9201 Otalgia, right ear: Secondary | ICD-10-CM | POA: Diagnosis not present

## 2023-08-05 MED ORDER — AMOXICILLIN-POT CLAVULANATE 875-125 MG PO TABS: 1.0000 | ORAL_TABLET | Freq: Two times a day (BID) | ORAL | 0 refills | Status: AC

## 2023-08-05 NOTE — ED Provider Notes (Addendum)
 Arlander Bellman    CSN: [id number] Arrival date & time: 08/05/23  1208      History   Chief Complaint Chief Complaint  Patient presents with   Dental Problem    HPI ARETHIA ATTEBURY is a 33 y.o. female.   Patient presents today with a 2 to 3-day history of right jaw and ear pain.  She reports that pain is rated 8 on a 0-10 pain scale, described as throbbing, worse with mastication, no alleviating factors identified.  She has not had any fever but has felt warm.  Denies any nausea, vomiting, swelling of her throat, shortness of breath.  She has been taking Orajel without improvement of symptoms.  She denies any recent antibiotic use.  She has not seen a dentist recently.  She is eating and drinking normally though it is painful to chew.  She does confirm that she is not pregnant as she has an IUD.    Past Medical History:  Diagnosis Date   Anemia    Anxiety    Bipolar 1 disorder (HCC)    Seizures (HCC)     Patient Active Problem List   Diagnosis Date Noted   Weakness of right lower extremity 03/31/2019   Anxiety 03/31/2019   Numbness and tingling of right lower extremity 03/31/2019   Encounter for screening for HIV 03/31/2019   Placental abruption in third trimester 02/19/2018   Vaginal bleeding, abnormal 01/31/2018   Vaginal bleeding in pregnancy, third trimester 01/21/2018   Preterm premature rupture of membranes (PPROM) with unknown onset of labor 01/21/2018   Bipolar I disorder (HCC) 11/16/2015   Panic attacks 11/16/2015    Past Surgical History:  Procedure Laterality Date   ADENOIDECTOMY     CESAREAN SECTION     tailbone     TONSILLECTOMY     WISDOM TOOTH EXTRACTION      OB History     Gravida  3   Para      Term      Preterm      AB  2   Living         SAB  2   IAB      Ectopic      Multiple      Live Births               Home Medications    Prior to Admission medications   Medication Sig Start Date End Date Taking?  Authorizing Provider  amoxicillin -clavulanate (AUGMENTIN) 875-125 MG tablet Take 1 tablet by mouth every 12 (twelve) hours. 08/05/23  Yes Vela Render, Betsey Brow, PA-C  UNABLE TO FIND Out patient physical therapy Patient not taking: Reported on 08/05/2023 04/01/19   Rizwan, Saima, MD    Family History Family History  Problem Relation Age of Onset   Depression Mother    Depression Sister     Social History Social History   Tobacco Use   Smoking status: Former    Types: Cigarettes   Smokeless tobacco: Never  Substance Use Topics   Alcohol use: No    Comment: Socially   Drug use: No     Allergies   Decadron [dexamethasone], Grapefruit extract, and Tape   Review of Systems Review of Systems  Constitutional:  Positive for activity change. Negative for appetite change, fatigue and fever.  HENT:  Positive for dental problem and ear pain. Negative for congestion, sinus pressure, sneezing and sore throat.   Respiratory:  Negative for cough and shortness of  breath.   Cardiovascular:  Negative for chest pain.  Gastrointestinal:  Negative for abdominal pain, diarrhea, nausea and vomiting.  Neurological:  Negative for dizziness, light-headedness and headaches.     Physical Exam Triage Vital Signs ED Triage Vitals [08/05/23 1312]  Encounter Vitals Group     BP (!) 147/88     Systolic BP Percentile      Diastolic BP Percentile      Pulse Rate 100     Resp 18     Temp 98.4 F (36.9 C)     Temp src      SpO2 98 %     Weight      Height      Head Circumference      Peak Flow      Pain Score 8     Pain Loc      Pain Education      Exclude from Growth Chart    No data found.  Updated Vital Signs BP (!) 147/88   Pulse 100   Temp 98.4 F (36.9 C)   Resp 18   LMP 07/29/2023   SpO2 98%   Breastfeeding No   Visual Acuity Right Eye Distance:   Left Eye Distance:   Bilateral Distance:    Right Eye Near:   Left Eye Near:    Bilateral Near:     Physical Exam Vitals  reviewed.  Constitutional:      General: She is awake. She is not in acute distress.    Appearance: Normal appearance. She is well-developed. She is not ill-appearing.     Comments: Very pleasant female appears stated age in no acute distress sitting comfortably in exam room  HENT:     Head: Normocephalic and atraumatic.     Right Ear: Tympanic membrane, ear canal and external ear normal. Tympanic membrane is not erythematous or bulging.     Left Ear: Tympanic membrane, ear canal and external ear normal. Tympanic membrane is not erythematous or bulging.     Nose:     Right Sinus: No maxillary sinus tenderness or frontal sinus tenderness.     Left Sinus: No maxillary sinus tenderness or frontal sinus tenderness.     Mouth/Throat:     Dentition: Gingival swelling present. No dental tenderness or dental abscesses.     Pharynx: Uvula midline. No oropharyngeal exudate or posterior oropharyngeal erythema.      Comments: No evidence of Ludwig angina Cardiovascular:     Rate and Rhythm: Normal rate and regular rhythm.     Heart sounds: Normal heart sounds, S1 normal and S2 normal. No murmur heard. Pulmonary:     Effort: Pulmonary effort is normal.     Breath sounds: Normal breath sounds. No wheezing, rhonchi or rales.     Comments: Clear to auscultation bilaterally Psychiatric:        Behavior: Behavior is cooperative.      UC Treatments / Results  Labs (all labs ordered are listed, but only abnormal results are displayed) Labs Reviewed - No data to display  EKG   Radiology No results found.  Procedures Procedures (including critical care time)  Medications Ordered in UC Medications - No data to display  Initial Impression / Assessment and Plan / UC Course  I have reviewed the triage vital signs and the nursing notes.  Pertinent labs & imaging results that were available during my care of the patient were reviewed by me and considered in my medical decision making (see  chart  for details).     Patient is well-appearing, afebrile, nontoxic, nontachycardic.  No indication for emergent evaluation or imaging.  Patient was treated for dental infection with Augmentin twice daily for 7 days.  She does have a history of seizures listed in her chart but reports that ultimately this was attributed to trauma and she does not actually have epilepsy.  She has safely taken amoxicillin /Augmentin in the past.  She can use over-the-counter analgesics for additional pain relief.  Recommend she gargle with warm salt water  for additional symptom relief.  Discussed that ultimately she will need to see dentist to address underlying tooth and provided low-cost dental resources in the area with after visit summary.  If she develops any worsening symptoms including fever, nausea, vomiting, swelling of her throat, muffled voice, dysphagia she needs to go to the emergency room to which he expressed understanding.     Final Clinical Impressions(s) / UC Diagnoses   Final diagnoses:  Dental infection  Pain, dental  Acute otalgia, right     Discharge Instructions      Start Augmentin twice daily for 7 days.  Take ibuprofen  and Tylenol  over-the-counter to help with your pain.  Gargle with warm salt water  for additional symptom relief.  You should follow-up with dentist; call to schedule an appointment.  If you develop any worsening symptoms including difficulty swallowing, difficulty speaking, swelling of your throat, high fever, change in your voice you need to be seen immediately.      ED Prescriptions     Medication Sig Dispense Auth. Provider   amoxicillin -clavulanate (AUGMENTIN) 875-125 MG tablet Take 1 tablet by mouth every 12 (twelve) hours. 14 tablet Tatsuya Okray K, PA-C      PDMP not reviewed this encounter.   Budd Cargo, PA-C 08/05/23 1352    Deondra Labrador, Betsey Brow, PA-C 08/05/23 1402

## 2023-08-05 NOTE — ED Triage Notes (Addendum)
 Patient to Urgent Care with complaints of dental pain. Reports pain is right sided, describes it being her entire jaw; radiates into right ear and face.   Symptoms started 2-3 days ago. Denies any known fevers; has felt "warm".   Using oral-gel.

## 2023-08-05 NOTE — Discharge Instructions (Signed)
 Start Augmentin twice daily for 7 days.  Take ibuprofen  and Tylenol  over-the-counter to help with your pain.  Gargle with warm salt water  for additional symptom relief.  You should follow-up with dentist; call to schedule an appointment.  If you develop any worsening symptoms including difficulty swallowing, difficulty speaking, swelling of your throat, high fever, change in your voice you need to be seen immediately.

## 2023-12-29 ENCOUNTER — Ambulatory Visit

## 2023-12-29 DIAGNOSIS — Z111 Encounter for screening for respiratory tuberculosis: Secondary | ICD-10-CM

## 2023-12-29 NOTE — Progress Notes (Addendum)
 Pt presents with form from [facility] verifying TB skin testing from 12/27/2023 @ 12:59 pm. Desires skin test to be read by [facility] staff because of lack of money.  PPD placement verified by form and in EPIC. PPDR from left lower forearm, Negative, 0 mm induration. Forms filled, copied and sent for scanning.  Kandi KATHEE Glatter, RN
